# Patient Record
Sex: Male | Born: 1980 | Race: White | Hispanic: No | Marital: Single | Smoking: Current every day smoker
Health system: Southern US, Community
[De-identification: ages and names within clinical notes are randomized; demographics above are authoritative.]

## PROBLEM LIST (undated history)

## (undated) DIAGNOSIS — L0390 Cellulitis, unspecified: Secondary | ICD-10-CM

## (undated) DIAGNOSIS — S3210XA Unspecified fracture of sacrum, initial encounter for closed fracture: Secondary | ICD-10-CM

## (undated) DIAGNOSIS — G35D Multiple sclerosis, unspecified: Secondary | ICD-10-CM

## (undated) DIAGNOSIS — G35 Multiple sclerosis: Secondary | ICD-10-CM

---

## 2014-07-03 ENCOUNTER — Emergency Department (HOSPITAL_COMMUNITY): Payer: Self-pay

## 2014-07-03 ENCOUNTER — Emergency Department (HOSPITAL_COMMUNITY)
Admission: EM | Admit: 2014-07-03 | Discharge: 2014-07-03 | Disposition: A | Discharge status: Home / Self Care | Payer: Self-pay | Attending: Emergency Medicine | Admitting: Emergency Medicine

## 2014-07-03 ENCOUNTER — Encounter (HOSPITAL_COMMUNITY): Payer: Self-pay | Admitting: Emergency Medicine

## 2014-07-03 DIAGNOSIS — R930 Abnormal findings on diagnostic imaging of skull and head, not elsewhere classified: Secondary | ICD-10-CM | POA: Insufficient documentation

## 2014-07-03 DIAGNOSIS — F10121 Alcohol abuse with intoxication delirium: Secondary | ICD-10-CM | POA: Insufficient documentation

## 2014-07-03 DIAGNOSIS — Z720 Tobacco use: Secondary | ICD-10-CM | POA: Insufficient documentation

## 2014-07-03 DIAGNOSIS — F10920 Alcohol use, unspecified with intoxication, uncomplicated: Secondary | ICD-10-CM

## 2014-07-03 LAB — I-STAT CHEM 8, ED
BUN: 12 mg/dL (ref 6–23)
CHLORIDE: 99 meq/L (ref 96–112)
CREATININE: 1.2 mg/dL (ref 0.50–1.35)
Calcium, Ion: 1.09 mmol/L — ABNORMAL LOW (ref 1.12–1.23)
GLUCOSE: 117 mg/dL — AB (ref 70–99)
HCT: 50 % (ref 39.0–52.0)
Hemoglobin: 17 g/dL (ref 13.0–17.0)
POTASSIUM: 3.6 meq/L — AB (ref 3.7–5.3)
Sodium: 139 mEq/L (ref 137–147)
TCO2: 27 mmol/L (ref 0–100)

## 2014-07-03 LAB — DIFFERENTIAL
Basophils Absolute: 0 10*3/uL (ref 0.0–0.1)
Basophils Relative: 0 % (ref 0–1)
Eosinophils Absolute: 0.1 10*3/uL (ref 0.0–0.7)
Eosinophils Relative: 1 % (ref 0–5)
LYMPHS ABS: 1.2 10*3/uL (ref 0.7–4.0)
LYMPHS PCT: 10 % — AB (ref 12–46)
MONO ABS: 0.8 10*3/uL (ref 0.1–1.0)
MONOS PCT: 7 % (ref 3–12)
Neutro Abs: 10.5 10*3/uL — ABNORMAL HIGH (ref 1.7–7.7)
Neutrophils Relative %: 82 % — ABNORMAL HIGH (ref 43–77)

## 2014-07-03 LAB — CBC
HEMATOCRIT: 45.1 % (ref 39.0–52.0)
HEMOGLOBIN: 15.7 g/dL (ref 13.0–17.0)
MCH: 34.4 pg — ABNORMAL HIGH (ref 26.0–34.0)
MCHC: 34.8 g/dL (ref 30.0–36.0)
MCV: 98.7 fL (ref 78.0–100.0)
Platelets: 169 10*3/uL (ref 150–400)
RBC: 4.57 MIL/uL (ref 4.22–5.81)
RDW: 13.3 % (ref 11.5–15.5)
WBC: 12.6 10*3/uL — AB (ref 4.0–10.5)

## 2014-07-03 LAB — URINALYSIS, ROUTINE W REFLEX MICROSCOPIC
Bilirubin Urine: NEGATIVE
Glucose, UA: NEGATIVE mg/dL
Hgb urine dipstick: NEGATIVE
Leukocytes, UA: NEGATIVE
NITRITE: NEGATIVE
Specific Gravity, Urine: >1.03 — ABNORMAL HIGH (ref 1.005–1.030)
UROBILINOGEN UA: 1 mg/dL (ref 0.0–1.0)
pH: 5.5 (ref 5.0–8.0)

## 2014-07-03 LAB — RAPID URINE DRUG SCREEN, HOSP PERFORMED
Amphetamines: NOT DETECTED
BARBITURATES: NOT DETECTED
BENZODIAZEPINES: NOT DETECTED
Cocaine: NOT DETECTED
Opiates: POSITIVE — AB
Tetrahydrocannabinol: NOT DETECTED

## 2014-07-03 LAB — PROTIME-INR
INR: 1 (ref 0.00–1.49)
Prothrombin Time: 13.3 seconds (ref 11.6–15.2)

## 2014-07-03 LAB — ETHANOL: Alcohol, Ethyl (B): 42 mg/dL — ABNORMAL HIGH (ref 0–11)

## 2014-07-03 LAB — COMPREHENSIVE METABOLIC PANEL
ALT: 17 U/L (ref 0–53)
AST: 24 U/L (ref 0–37)
Albumin: 4.5 g/dL (ref 3.5–5.2)
Alkaline Phosphatase: 47 U/L (ref 39–117)
Anion gap: 13 (ref 5–15)
BILIRUBIN TOTAL: 0.2 mg/dL — AB (ref 0.3–1.2)
BUN: 13 mg/dL (ref 6–23)
CHLORIDE: 99 meq/L (ref 96–112)
CO2: 29 meq/L (ref 19–32)
CREATININE: 1.14 mg/dL (ref 0.50–1.35)
Calcium: 8.9 mg/dL (ref 8.4–10.5)
GFR calc Af Amer: >90 mL/min (ref >90)
GFR, EST NON AFRICAN AMERICAN: 84 mL/min — AB (ref >90)
Glucose, Bld: 114 mg/dL — ABNORMAL HIGH (ref 70–99)
Potassium: 3.8 mEq/L (ref 3.7–5.3)
Sodium: 141 mEq/L (ref 137–147)
Total Protein: 7.7 g/dL (ref 6.0–8.3)

## 2014-07-03 LAB — URINE MICROSCOPIC-ADD ON

## 2014-07-03 LAB — I-STAT TROPONIN, ED: Troponin i, poc: 0 ng/mL (ref 0.00–0.08)

## 2014-07-03 LAB — APTT: aPTT: 28 seconds (ref 24–37)

## 2014-07-03 MED ORDER — SODIUM CHLORIDE 0.9 % IV BOLUS (SEPSIS)
1000.0000 mL | Freq: Once | INTRAVENOUS | Status: AC
  Administered 2014-07-03: 1000 mL via INTRAVENOUS

## 2014-07-03 NOTE — ED Provider Notes (Signed)
CSN: [id number]     Arrival date & time 07/03/14  [id number] History   First MD Initiated Contact with Patient 07/03/14 [phone]     Chief Complaint  Patient presents with  . Weakness  . Dizziness      HPI Pt was seen at 0910.  Per pt, c/o unknown onset and persistence of constant generalized weakness/fatigue, lightheadedness and slurred speech that was noticed when he woke up this morning at 0400. Pt states he went to bed last night approximately 2300 "after having some drinks celebrating my birthday." Has been associated with one episode of N/V PTA. Pt states all his symptoms have been constant since onset and have not improved.  Denies LOC, no AMS, no CP/palpitations, no cough/SOB, no abd pain, no diarrhea, no fevers, no black or blood in stools or emesis, no focal motor weakness, no tingling/numbness in extremities.       History reviewed. No pertinent past medical history.  History reviewed. No pertinent past surgical history.  History  Substance Use Topics  . Smoking status: Current Every Day Smoker  . Smokeless tobacco: Not on file  . Alcohol Use: Yes     Comment: occ    Review of Systems ROS: Statement: All systems negative except as marked or noted in the HPI; Constitutional: Negative for fever and chills. +generalized weakness/fatigue.; ; Eyes: Negative for eye pain, redness and discharge. ; ; ENMT: Negative for ear pain, hoarseness, nasal congestion, sinus pressure and sore throat. ; ; Cardiovascular: Negative for chest pain, palpitations, diaphoresis, dyspnea and peripheral edema. ; ; Respiratory: Negative for cough, wheezing and stridor. ; ; Gastrointestinal: +N/V. Negative for diarrhea, abdominal pain, blood in stool, hematemesis, jaundice and rectal bleeding. . ; ; Genitourinary: Negative for dysuria, flank pain and hematuria. ; ; Musculoskeletal: Negative for back pain and neck pain. Negative for swelling and trauma.; ; Skin: Negative for pruritus, rash, abrasions, blisters,  bruising and skin lesion.; ; Neuro: +lightheadedness, slurred speech. Negative for headache and neck stiffness. Negative for altered level of consciousness , altered mental status, extremity weakness, paresthesias, involuntary movement, seizure and syncope.      Allergies  Review of patient's allergies indicates no known allergies.  Home Medications   Prior to Admission medications   Not on File   BP 115/66  Pulse 89  Temp(Src) 97.5 F (36.4 C) (Oral)  Resp 20  SpO2 99% Physical Exam 0915: Physical examination:  Nursing notes reviewed; Vital signs and O2 SAT reviewed;  Constitutional: Well developed, Well nourished, Well hydrated, In no acute distress; Head:  Normocephalic, atraumatic; Eyes: EOMI, PERRL, No scleral icterus; ENMT: Mouth and pharynx normal, Mucous membranes moist; Neck: Supple, Full range of motion, No lymphadenopathy; Cardiovascular: Regular rate and rhythm, No murmur, rub, or gallop; Respiratory: Breath sounds clear & equal bilaterally, No rales, rhonchi, wheezes.  Speaking full sentences with ease, Normal respiratory effort/excursion; Chest: Nontender, Movement normal; Abdomen: Soft, Nontender, Nondistended, Normal bowel sounds; Genitourinary: No CVA tenderness; Extremities: Pulses normal, No tenderness, No edema, No calf edema or asymmetry.; Neuro: AA&Ox3, Major CN grossly intact.Speech clear.  No facial droop.  No nystagmus. Grips equal. Strength 5/5 equal bilat UE's and LE's.  DTR 2/4 equal bilat UE's and LE's.  No gross sensory deficits.  Normal cerebellar testing bilat UE's (finger-nose) and LE's (heel-shin)..; Skin: Color normal, Warm, Dry.   ED Course  Procedures     EKG Interpretation   Date/Time:  Wednesday July 03 2014 09:11:41 EDT Ventricular Rate:  97 PR Interval:  128 QRS Duration: 109 QT Interval:  383 QTC Calculation: 486 R Axis:   88 Text Interpretation:  Sinus rhythm Consider right atrial enlargement  Benign early repolarization Borderline  prolonged QT interval Artifact No  old tracing to compare Confirmed by [facility]  MD, Nicholos Johns [phone]) on  07/03/2014 9:29:19 AM      MDM  MDM Reviewed: nursing note and vitals Interpretation: labs, ECG, x-ray and CT scan     Results for orders placed during the hospital encounter of 07/03/14  ETHANOL      Result Value Ref Range   Alcohol, Ethyl (B) 42 (*) 0 - 11 mg/dL  PROTIME-INR      Result Value Ref Range   Prothrombin Time 13.3  11.6 - 15.2 seconds   INR 1.00  0.00 - 1.49  APTT      Result Value Ref Range   aPTT 28  24 - 37 seconds  CBC      Result Value Ref Range   WBC 12.6 (*) 4.0 - 10.5 K/uL   RBC 4.57  4.22 - 5.81 MIL/uL   Hemoglobin 15.7  13.0 - 17.0 g/dL   HCT 00.3  49.1 - 79.1 %   MCV 98.7  78.0 - 100.0 fL   MCH 34.4 (*) 26.0 - 34.0 pg   MCHC 34.8  30.0 - 36.0 g/dL   RDW 50.5  69.7 - 94.8 %   Platelets 169  150 - 400 K/uL  DIFFERENTIAL      Result Value Ref Range   Neutrophils Relative % 82 (*) 43 - 77 %   Neutro Abs 10.5 (*) 1.7 - 7.7 K/uL   Lymphocytes Relative 10 (*) 12 - 46 %   Lymphs Abs 1.2  0.7 - 4.0 K/uL   Monocytes Relative 7  3 - 12 %   Monocytes Absolute 0.8  0.1 - 1.0 K/uL   Eosinophils Relative 1  0 - 5 %   Eosinophils Absolute 0.1  0.0 - 0.7 K/uL   Basophils Relative 0  0 - 1 %   Basophils Absolute 0.0  0.0 - 0.1 K/uL  COMPREHENSIVE METABOLIC PANEL      Result Value Ref Range   Sodium 141  137 - 147 mEq/L   Potassium 3.8  3.7 - 5.3 mEq/L   Chloride 99  96 - 112 mEq/L   CO2 29  19 - 32 mEq/L   Glucose, Bld 114 (*) 70 - 99 mg/dL   BUN 13  6 - 23 mg/dL   Creatinine, Ser 0.16  0.50 - 1.35 mg/dL   Calcium 8.9  8.4 - 55.3 mg/dL   Total Protein 7.7  6.0 - 8.3 g/dL   Albumin 4.5  3.5 - 5.2 g/dL   AST 24  0 - 37 U/L   ALT 17  0 - 53 U/L   Alkaline Phosphatase 47  39 - 117 U/L   Total Bilirubin 0.2 (*) 0.3 - 1.2 mg/dL   GFR calc non Af Amer 84 (*) >90 mL/min   GFR calc Af Amer >90  >90 mL/min   Anion gap 13  5 - 15  URINALYSIS, ROUTINE  W REFLEX MICROSCOPIC      Result Value Ref Range   Color, Urine YELLOW  YELLOW   APPearance CLEAR  CLEAR   Specific Gravity, Urine >1.030 (*) 1.005 - 1.030   pH 5.5  5.0 - 8.0   Glucose, UA NEGATIVE  NEGATIVE mg/dL   Hgb urine dipstick NEGATIVE  NEGATIVE  Bilirubin Urine NEGATIVE  NEGATIVE   Ketones, ur TRACE (*) NEGATIVE mg/dL   Protein, ur TRACE (*) NEGATIVE mg/dL   Urobilinogen, UA 1.0  0.0 - 1.0 mg/dL   Nitrite NEGATIVE  NEGATIVE   Leukocytes, UA NEGATIVE  NEGATIVE  URINE MICROSCOPIC-ADD ON      Result Value Ref Range   RBC / HPF 0-2  <3 RBC/hpf   Casts HYALINE CASTS (*) NEGATIVE  I-STAT CHEM 8, ED      Result Value Ref Range   Sodium 139  137 - 147 mEq/L   Potassium 3.6 (*) 3.7 - 5.3 mEq/L   Chloride 99  96 - 112 mEq/L   BUN 12  6 - 23 mg/dL   Creatinine, Ser 1.61  0.50 - 1.35 mg/dL   Glucose, Bld 096 (*) 70 - 99 mg/dL   Calcium, Ion 0.45 (*) 1.12 - 1.23 mmol/L   TCO2 27  0 - 100 mmol/L   Hemoglobin 17.0  13.0 - 17.0 g/dL   HCT 40.9  81.1 - 91.4 %  I-STAT TROPOININ, ED      Result Value Ref Range   Troponin i, poc 0.00  0.00 - 0.08 ng/mL   Comment 3            Dg Chest 2 View 07/03/2014   CLINICAL DATA:  Shortness of breath, nausea and weakness. Right arm and hand numbness. Symptoms began at 4 a.m. Smoker.  EXAM: CHEST  2 VIEW  COMPARISON:  None.  FINDINGS: The lungs are clear. Heart size is normal. There is no pneumothorax or pleural effusion. No focal bony abnormality is identified.  IMPRESSION: Negative chest.   Electronically Signed   By: Drusilla Kanner M.D.   On: 07/03/2014 10:32   Ct Head Wo Contrast 07/03/2014   CLINICAL DATA:  Weakness and dizziness.  Slurred speech  EXAM: CT HEAD WITHOUT CONTRAST  TECHNIQUE: Contiguous axial images were obtained from the base of the skull through the vertex without intravenous contrast.  COMPARISON:  None.  FINDINGS: Ventricle size is normal.  Cerebral volume is normal.  Hypodensity in the right centrum semiovale has the  appearance of chronic infarction. Negative for acute infarct. Negative for hemorrhage or mass lesion.  Calvarium is intact.  Mucosal edema in the right maxillary sinus.  IMPRESSION: Hypodensity in the centrum semiovale on the right most likely an area of chronic infarction. Correlate with symptoms. MRI may be helpful given the patient's age and lack of prior neuro imaging studies.   Electronically Signed   By: Marlan Palau M.D.   On: 07/03/2014 10:18   Mr Brain Wo Contrast 07/03/2014   CLINICAL DATA:  Right arm weakness. Dizziness. Symptoms began today.  EXAM: MRI HEAD WITHOUT CONTRAST  TECHNIQUE: Multiplanar, multiecho pulse sequences of the brain and surrounding structures were obtained without intravenous contrast.  COMPARISON:  CT 07/03/2014  FINDINGS: Diffusion imaging does not show any acute or subacute infarction.  The brainstem and cerebellum are normal. The cerebral hemispheres show scattered areas of abnormal T2 and FLAIR signal within the hemispheric deep white matter, pattern and distribution suggestive of chronic multiple sclerosis. No mass lesion, hemorrhage, hydrocephalus or extra-axial collection. No pituitary mass. Right maxillary sinus is full of fluid with and slightly hypoplastic.  IMPRESSION: No acute infarction.  Chronic appearing deep white matter lesions within the cerebral hemispheres suggestive of the diagnosis of chronic multiple sclerosis.   Electronically Signed   By: Paulina Fusi M.D.   On: 07/03/2014 11:35  1220:  Pt states he feels better now and wants to go home. Pt's NIH scale continues 0. Alcohol elevated 5 hours after pt states he woke up with symptoms (level at that time likely 100+). Pt endorses now that he drank "several 5th's" yesterday for his birthday celebration. Today's symptoms most consistent with etoh intoxication. MRI negative for acute CVA, but with suggestion of chronic MS. Pt endorses he had a CT-H approximately 3 years ago after hitting his head and was  told "they saw some spots on it and wanted me to follow up." Pt states he "never did follow up because I just didn't want to know." T/C to Neuro Dr. Gerilyn Pilgrim, case discussed, including:  HPI, pertinent PM/SHx, VS/PE, dx testing, ED course and treatment:  Requests to have pt f/u in office regarding MRI findings. Dx and testing d/w pt and family.  Questions answered.  Verb understanding, agreeable to d/c home with outpt f/u.    Samuel Jester, DO 07/05/14 [phone]

## 2014-07-03 NOTE — ED Notes (Signed)
Pt denies any questions about D/C. Pt states understanding. Pt instructed to increase fluids and follow up with neurologist.

## 2014-07-03 NOTE — Discharge Instructions (Signed)
°Emergency Department Resource Guide °1) Find a Doctor and Pay Out of Pocket °Although you won't have to find out who is covered by your insurance plan, it is a good idea to ask around and get recommendations. You will then need to call the office and see if the doctor you have chosen will accept you as a new patient and what types of options they offer for patients who are self-pay. Some doctors offer discounts or will set up payment plans for their patients who do not have insurance, but you will need to ask so you aren't surprised when you get to your appointment. ° °2) Contact Your Local Health Department °Not all health departments have doctors that can see patients for sick visits, but many do, so it is worth a call to see if yours does. If you don't know where your local health department is, you can check in your phone book. The CDC also has a tool to help you locate your state's health department, and many state websites also have listings of all of their local health departments. ° °3) Find a Walk-in Clinic °If your illness is not likely to be very severe or complicated, you may want to try a walk in clinic. These are popping up all over the country in pharmacies, drugstores, and shopping centers. They're usually staffed by nurse practitioners or physician assistants that have been trained to treat common illnesses and complaints. They're usually fairly quick and inexpensive. However, if you have serious medical issues or chronic medical problems, these are probably not your best option. ° °No Primary Care Doctor: °- Call [facility] Connect at  832-8000 - they can help you locate a primary care doctor that  accepts your insurance, provides certain services, etc. °- Physician Referral Service- 1-800-533-3463 ° °Chronic Pain Problems: °Organization         Address  Phone   Notes  °[facility]  ([phone]) [phone] Patients need to be referred by their primary care doctor.  ° °Medication  Assistance: °Organization         Address  Phone   Notes  °[place] [facility] [address] °[place], [place] [address] (336) 641-8030 --Must be a resident of [place] °-- Must have NO insurance coverage whatsoever (no Medicaid/ Medicare, etc.) °-- The pt. MUST have a primary care doctor that directs their care regularly and follows them in the community °  °[facility]  (866) 331-1348   °[employer]  (888) 892-1162   ° °Agencies that provide inexpensive medical care: °Organization         Address  Phone   Notes  °[facility]  (336) 832-8035   °[facility]    (336) 832-7272   °[facility] [address] °[place], [place] [address] (336) 832-4777   °[facility] [address], °[place] (336) 271-4999   °[facility]    (336) 373-0678   °[facility]    (336) 272-1050   °[facility] ° [address], [place] Phone:  (336) 832-4444, Fax:  (336) 832-4440 Hours of Operation:  9 am - 6 pm, M-F.  Also accepts Medicaid/Medicare and self-pay.  °[facility] ° [address], [place] Phone: (336) [phone], Fax: (336) [phone]. Hours of Operation:  8:30 am - 5:30 pm, M-F.  Also accepts Medicaid and self-pay.  °[facility] [place] [address], [place] Phone: (336) 878-6027   °[facility] [address], [place], [place] (336)723-1848, Ext. 123 Mondays & Thursdays: 7-9 AM.  First 15 patients are seen on a first come, first serve basis. °  ° °Medicaid-accepting [place] Providers: ° °Organization         Address  Phone   Notes  °[facility] [address], [place] (336) 641-2100 Also accepts self-pay patients.  °[facility] [address], [place] ° (336) 856-9996   °[facility] [address], [place]  (336) 288-8857   °[facility] [address] [place] [address], [place] (336) 299-7000   °Veita Bland [address], [place]  ° (336) 373-1557 Only accepts [place] Access Medicaid patients after they have their name applied to their card.  ° °Self-Pay (no insurance) in [place]: ° °Organization         Address  Phone   Notes  °Sickle Cell Patients, [facility] [address], [place] (336) 832-1970   °[facility] [address], [place] (336) 832-4400   °[facility] ° [address], [place] (336) 992-4800   °[facility]/Dr. Osei-Bonsu ° [address] [place] [address], [place] or [address], [place] (336) [phone] Phone number for both [place] and [place] locations is the same.  °[facility] [address], [place] (336) 299-0000   °[facility] [address] [place] [address], [place] or [address] (336) 852-7530 °(336) 878-2260   °[facility] [address], [place] (336) 350-1642, phone; (336) 294-5005, fax Sees patients 1st and 3rd Saturday of every month.  Must not qualify for public or private insurance (i.e. Medicaid, Medicare, [place] Health Choice, Veterans' Benefits) • Household income should be no more than 200% of the poverty level •The clinic cannot treat you if you are pregnant or think you are pregnant • Sexually transmitted diseases are not treated at the clinic.  ° ° °Dental Care: °Organization         Address  Phone  Notes  °[place] [facility] [address], [place] (336) 641-6152 Accepts children up to age 21 who are enrolled in Medicaid or [place] Health Choice; pregnant women with a Medicaid card; and children who have applied for Medicaid or [place] Health Choice, but were declined, whose parents can pay a reduced fee at time of service.  °[place]  [facility] [place]  [address], [place] (336) 641-7733 Accepts children up to age 21 who are enrolled in Medicaid or [place] Health Choice; pregnant women with a Medicaid card; and children who have applied for Medicaid or [place] Health Choice, but were declined, whose parents can pay a reduced fee at time of service.  °[facility] ° [address], [place] (336) 641-4533 Patients are seen by appointment only. Walk-ins are not accepted. [facility] will see patients 18 years of age and older. °Monday - Tuesday (8am-5pm) °Most Wednesdays (8:30-5pm) °$30 per visit, cash only  °[facility] ° [address], [place] (336) 641-4533 Patients are seen by appointment only. Walk-ins are not accepted. [facility] will see patients 18 years of age and older. °One   Wednesday Evening (Monthly: Volunteer Based).  $30 per visit, cash only  °[facility]  (919) 537-3737 for adults; Children under age 4, call Graduate Pediatric Dentistry at ([phone]) [phone]. Children aged 4-14, please call (919) [phone] to request a pediatric application. ° Dental services are provided in all areas of dental care including fillings, crowns and bridges, complete and partial dentures, implants, gum treatment, root canals, and extractions. Preventive care is also provided. Treatment is provided to both adults and children. °Patients are selected via a lottery and there is often a waiting list. °  °[facility] [address], °[place] ° (336) 763-8833 [email or web address] °  °[facility] [address], [place], [place] (336)723-1848, Ext. 123 Second and Fourth Thursday of each month, opens at 6:30 AM; Clinic ends at 9 AM.  Patients are seen on a first-come first-served basis, and a limited number are seen during each clinic.  ° °[facility] ° [address], [place], [place] (336) 723-7904    Eligibility Requirements °You must have lived in [place], [place], or [place] counties for at least the last three months. °  You cannot be eligible for state or federal sponsored healthcare insurance, including Veterans Administration, Medicaid, or Medicare. °  You generally cannot be eligible for healthcare insurance through your employer.  °  How to apply: °Eligibility screenings are held every Tuesday and Wednesday afternoon from 1:00 pm until 4:00 pm. You do not need an appointment for the interview!  °[facility] [address], [place], [place] 336-631-2330   °[facility]  336-342-8273   °[facility]  336-703-3100   °[facility]  336-570-6415   ° °Behavioral Health Resources in the Community: °Intensive Outpatient Programs °Organization         Address  Phone  Notes  °[facility] [address], [place], [place] 336-878-6098   °[facility] [address], [place], [place] 336-832-9800   °[facility] [address], [place], [place] ° 336-882-2125   °[facility] [address],  °[place], [place] 1-800-853-5163 or 336-641-4981   °Substance Abuse Resources °Organization         Address  Phone  Notes  °[facility]  336-882-2125   °[facility]  336-784-9470   °[facility]  336-285-9073   °[facility]  336-845-3988   °Residential & Outpatient Substance Abuse Program  1-800-659-3381   °Psychological Services °Organization         Address  Phone  Notes  °[facility]  336- 832-9600   °[facility]  336- 378-7881   °[facility] [address], [place] 1-800-853-5163 or 336-641-4981   ° °Mobile Crisis Teams °Organization         Address  Phone  Notes  °[facility], Mobile Crisis Care Unit  1-877-626-1772   °Assertive °[facility] ° [address]  [place], [place] 336-834-9664   °Sharon DeEsch [address] °[place] 336-554-5454   ° °Self-Help/Support Groups °Organization         Address  Phone             Notes  °[facility]  - variety of support groups  336- 373-1402 Call for more information  °Narcotics Anonymous (NA), [facility] [address], °[place]  2 meetings at this location  ° °  Residential Treatment Programs Organization         Address  Phone  Notes  [facility] [address],    [place]  9-024-097-3532   [facility]  [address], [place] [address], [place], [place] 834-196-2229   [facility] [address] [place], [place] [phone] Admissions: 8am-3pm M-F  [facility] [address]    [place], [place] [phone]   [facility] [address] [place], [place], [place] [phone]   [facility] [address],  [place], [place] [phone]   [facility] Intensive Outpatient [address], [place], [place] 741-287-8676   [facility] ([facility] [address] [place]  [place], [place] 7-209-470-9628 or (502) 057-5613   [facility] ([facility] [address] [place], [place] 650-354-6568 Accepts Medicaid  [facility] [address],  [place] 1-275-170-0174 Substance Abuse/Addiction Treatment   [facility] Organization         Address  Phone  Notes  [facility]  (949) 388-0177   Angie Fava, PhD [address] [place], [place]   (973)082-3218 or (930) 356-2335   [facility]   [address] [place], [place] [phone]   [facility] [address], [place], [place] 810-771-3367 Insurance/Medicaid/sponsorship through [facility] [address]                                    [place], [place] (519)[phone] Therapy/tele-psych/case    [facility] [address], [place] [phone]    Dr. Lolly Mustache  [phone]   [facility]  [employer] [facility] Dept. 1) [address], [place] 2) [address], [place] 3)  [address], [place] ([phone]  [phone]   [facility] Child Abuse Hotline 3302126847 or 4434811345 (After Hours)       Increase your fluid intake (ie:  Gatoraide) for the next few days, as discussed. Call the Neurologist today to schedule a follow up appointment this week.  Return to the Emergency Department immediately if not improving (or even worsening), any black or bloody stool or vomit, or for any other concerns.

## 2014-07-03 NOTE — ED Notes (Signed)
Pt reports woke up around 4am to get ready for work.  Says felt very nauseated, weak, and had difficulty forming his sentences.  Says feels weak all over but r arm and hand feel numb.  Pt reports feeling a little better and speech is better but not back to baseline per friend.

## 2014-07-08 ENCOUNTER — Encounter (HOSPITAL_COMMUNITY): Payer: Self-pay | Admitting: Emergency Medicine

## 2014-07-08 ENCOUNTER — Emergency Department (HOSPITAL_COMMUNITY)
Admission: EM | Admit: 2014-07-08 | Discharge: 2014-07-08 | Disposition: A | Discharge status: Home / Self Care | Payer: Self-pay | Attending: Emergency Medicine | Admitting: Emergency Medicine

## 2014-07-08 DIAGNOSIS — K006 Disturbances in tooth eruption: Secondary | ICD-10-CM | POA: Insufficient documentation

## 2014-07-08 DIAGNOSIS — K047 Periapical abscess without sinus: Secondary | ICD-10-CM | POA: Insufficient documentation

## 2014-07-08 DIAGNOSIS — Z720 Tobacco use: Secondary | ICD-10-CM | POA: Insufficient documentation

## 2014-07-08 DIAGNOSIS — Z8669 Personal history of other diseases of the nervous system and sense organs: Secondary | ICD-10-CM | POA: Insufficient documentation

## 2014-07-08 HISTORY — DX: Multiple sclerosis, unspecified: G35.D

## 2014-07-08 HISTORY — DX: Multiple sclerosis: G35

## 2014-07-08 MED ORDER — NAPROXEN 500 MG PO TABS: 500.0000 mg | ORAL_TABLET | Freq: Two times a day (BID) | ORAL | Status: DC

## 2014-07-08 MED ORDER — AMOXICILLIN 500 MG PO CAPS: 500.0000 mg | ORAL_CAPSULE | Freq: Three times a day (TID) | ORAL | Status: DC

## 2014-07-08 MED ORDER — AMOXICILLIN 250 MG PO CAPS
500.0000 mg | ORAL_CAPSULE | Freq: Once | ORAL | Status: AC
  Administered 2014-07-08: 500 mg via ORAL
  Filled 2014-07-08: qty 2

## 2014-07-08 MED ORDER — HYDROCODONE-ACETAMINOPHEN 5-325 MG PO TABS
2.0000 | ORAL_TABLET | Freq: Once | ORAL | Status: AC
  Administered 2014-07-08: 2 via ORAL
  Filled 2014-07-08: qty 2

## 2014-07-08 MED ORDER — HYDROCODONE-ACETAMINOPHEN 5-325 MG PO TABS: 2.0000 | ORAL_TABLET | ORAL | Status: DC | PRN

## 2014-07-08 NOTE — Discharge Instructions (Signed)
Please call your doctor for a followup appointment within 24-48 hours. When you talk to your doctor please let them know that you were seen in the emergency department and have them acquire all of your records so that they can discuss the findings with you and formulate a treatment plan to fully care for your new and ongoing problems.   Emergency Department Resource Guide 1) Find a Doctor and Pay Out of Pocket Although you won't have to find out who is covered by your insurance plan, it is a good idea to ask around and get recommendations. You will then need to call the office and see if the doctor you have chosen will accept you as a new patient and what types of options they offer for patients who are self-pay. Some doctors offer discounts or will set up payment plans for their patients who do not have insurance, but you will need to ask so you aren't surprised when you get to your appointment.  2) Contact Your Local Health Department Not all health departments have doctors that can see patients for sick visits, but many do, so it is worth a call to see if yours does. If you don't know where your local health department is, you can check in your phone book. The CDC also has a tool to help you locate your state's health department, and many state websites also have listings of all of their local health departments.  3) Find a Walk-in Clinic If your illness is not likely to be very severe or complicated, you may want to try a walk in clinic. These are popping up all over the country in pharmacies, drugstores, and shopping centers. They're usually staffed by nurse practitioners or physician assistants that have been trained to treat common illnesses and complaints. They're usually fairly quick and inexpensive. However, if you have serious medical issues or chronic medical problems, these are probably not your best option.  No Primary Care Doctor: - Call Health Connect at  [phone] - they can help you  locate a primary care doctor that  accepts your insurance, provides certain services, etc. - Physician Referral Service- [phone]  Chronic Pain Problems: Organization         Address  Phone   Notes  [facility]  [phone] Patients need to be referred by their primary care doctor.   Medication Assistance: Organization         Address  Phone   Notes  [facility] [address] [place], [place] [address] [phone] --Must be a resident of [place] -- Must have NO insurance coverage whatsoever (no Medicaid/ Medicare, etc.) -- The pt. MUST have a primary care doctor that directs their care regularly and follows them in the community   [facility]  [phone]   [employer]  [phone]    Agencies that provide inexpensive medical care: Organization         Address  Phone   Notes  [facility]  [phone]   [facility]    [phone]   [facility] [address] [place], [place] [address] [phone]   [facility] [address], [place] [phone]   [facility]    [phone]) [phone]   [facility]    [phone]   [facility]  [address], [place] Phone:  [phone], Fax:  [phone] Hours of Operation:  9 am - 6 pm, M-F.  Also accepts Medicaid/Medicare and self-pay.  °[facility] ° [address], [place] Phone: [phone], Fax: [phone]. Hours of Operation:  8:30 am - 5:30 pm, M-F.  Also accepts Medicaid and self-pay.  °[facility] [place] [address], [place] Phone: [phone]   °[facility] [address], [place], [place] [phone], Ext. [phone] Mondays & Thursdays: 7-9 AM.  First 15 patients are seen on a first come, first serve basis. °  ° °Medicaid-accepting [place]  Providers: ° °Organization         Address  Phone   Notes  °[facility] [address], [place] [phone] Also accepts self-pay patients.  °[facility] [address], [place] ° [phone]   °[facility] [address], [place] [phone]   °[facility] [address] [place] [address], [place] [phone]   °Veita Bland [address], [place]  ° [phone] Only accepts [place] Access Medicaid patients after they have their name applied to their card.  ° °Self-Pay (no insurance) in [place]: ° °Organization         Address  Phone   Notes  °Sickle Cell Patients, [facility] [address], [place] [phone]   °[facility] [address], [place] [phone]   °[facility] ° [address],  [phone]   °[facility]/Dr. Osei-Bonsu ° [address] [place] [address], [place] or [address], [place] [phone] Phone number for both [place] and [place] locations is the same.  °[facility] [address], [place] [phone]   °[facility] [address] [place] [address], [place] or [address] [phone] °([phone]   °[facility] [address], [place] [phone], phone; [phone], fax Sees patients 1st and 3rd Saturday of every month.  Must not qualify for public or private insurance (i.e. Medicaid, Medicare, [place] Health Choice, Veterans' Benefits) • Household income should be no more than 200% of the poverty level •The clinic cannot treat you if you are pregnant or think you are pregnant • Sexually transmitted diseases are not treated at the clinic.  ° ° °Dental Care: °Organization         Address  Phone  Notes  °[facility] [address], [place] [phone] Accepts children up to age 21 who are enrolled in Medicaid or [place] Health Choice; pregnant women with a Medicaid card; and children who have applied for Medicaid or [place] Health Choice, but were declined, whose parents can pay a reduced fee at time of service.  °[facility] [place]  [address], [place] [phone] Accepts children up to age 21 who are enrolled in Medicaid or  Health Choice; pregnant women with a Medicaid card; and children who have applied for Medicaid or  Health Choice, but were declined, whose parents can pay a reduced fee at time of service.  °[facility] ° [address], [place] [phone] Patients are seen by appointment only. Walk-ins are   not accepted. [facility] will see patients 41 years of age and older. Monday - Tuesday (8am-5pm) Most Wednesdays (8:30-5pm) $30 per visit, cash only  [facility]  [address], [facility] [phone] Patients are seen by appointment only. Walk-ins are not accepted. [facility] will see patients 48 years of age and older. One Wednesday Evening (Monthly: Volunteer Based).  $30 per visit, cash only  [employer]  [phone] for adults; Children under age 35, call Graduate Pediatric Dentistry at [phone]. Children aged 46-14, please call [phone] to request a pediatric application.  Dental services are provided in all areas of dental care including fillings, crowns and bridges, complete and partial dentures, implants, gum treatment, root canals, and extractions. Preventive care is also provided. Treatment is provided to both adults and children. Patients are selected via a lottery and there is often a waiting list.   [facility] [address] [place]  [phone] [email or web address]   [facility]  [address] [place], [place] [phone], Ext. [phone] Second and Fourth Thursday of each month, opens at 6:30 AM; Clinic ends at 9 AM.  Patients are seen on a first-come first-served basis, and a limited number are seen during each clinic.   [facility]  [address] [place], [place] [phone]   Eligibility Requirements You must have lived in [place], [place], or [place] counties for at least the last three months.   You cannot be eligible for state or federal sponsored [place], including [employer], [place], or [employer].   You generally cannot be eligible for healthcare insurance through your employer.    How to apply: Eligibility screenings are held every Tuesday and Wednesday afternoon from 1:00 pm until 4:00 pm. You do not need an appointment for the interview!  [facility] [address], [place], [place] [phone]   [facility] Health Department  [phone]   [facility] Department  [phone]   [facility]  [phone]

## 2014-07-08 NOTE — ED Provider Notes (Signed)
CSN: [id number]     Arrival date & time 07/08/14  2033 History   First MD Initiated Contact with Patient 07/08/14 2041     Chief Complaint  Patient presents with  . Dental Pain     (Consider location/radiation/quality/duration/timing/severity/associated sxs/prior Treatment) HPI Comments: The patient is a 33 year old male who presents with a complaint of a toothache. The dental pain is in his lower right jaw near the back of his jaw. He has mild associated swelling of his right jaw. He has no difficulty opening his mouth and has not had any fevers chills nausea or vomiting. The symptoms are persistent, they started yesterday, they are gradually worsening, they are worsened with chewing or palpation of his teeth on the right lower jaw. He states that he has had multiple fillings in the past, they have started to come out and his teeth are chipping away, he has not seen a dentist.  Patient is a 33 y.o. male presenting with tooth pain. The history is provided by the patient.  Dental Pain Associated symptoms: no fever     Past Medical History  Diagnosis Date  . MS (multiple sclerosis)    History reviewed. No pertinent past surgical history. No family history on file. History  Substance Use Topics  . Smoking status: Current Every Day Smoker  . Smokeless tobacco: Not on file  . Alcohol Use: Yes     Comment: occ    Review of Systems  Constitutional: Negative for fever.  HENT: Positive for dental problem.       Allergies  Review of patient's allergies indicates no known allergies.  Home Medications   Prior to Admission medications   Medication Sig Start Date End Date Taking? Authorizing Provider  amoxicillin (AMOXIL) 500 MG capsule Take 1 capsule (500 mg total) by mouth 3 (three) times daily. 07/08/14   Vida Roller, MD  HYDROcodone-acetaminophen (NORCO/VICODIN) 5-325 MG per tablet Take 2 tablets by mouth every 4 (four) hours as needed. 07/08/14   Vida Roller, MD  naproxen  (NAPROSYN) 500 MG tablet Take 1 tablet (500 mg total) by mouth 2 (two) times daily with a meal. 07/08/14   Vida Roller, MD   BP 148/69  Pulse 80  Temp(Src) 98.2 F (36.8 C) (Oral)  Resp 20  Ht 5\' 8"  (1.727 m)  Wt 142 lb (64.411 kg)  BMI 21.60 kg/m2  SpO2 100% Physical Exam  Nursing note and vitals reviewed. Constitutional: He appears well-developed and well-nourished. No distress.  HENT:  Head: Normocephalic and atraumatic.  Very poor dentition, multiple teeth with severe erosion, right lower rear molars with severe erosion with tenderness over these areas, no surrounding swelling but there is some redness of the gingiva, no fluctuant abscess seen, no asymmetry of the jaw, no trismus or torticollis, no tenderness under the tongue, normal temperature, normal phonation and  Eyes: Conjunctivae and EOM are normal. Pupils are equal, round, and reactive to light. Right eye exhibits no discharge. Left eye exhibits no discharge. No scleral icterus.  Neck: Normal range of motion. Neck supple. No JVD present. No thyromegaly present.  Cardiovascular: Normal rate and intact distal pulses.   Pulmonary/Chest: Effort normal. No respiratory distress.  Abdominal: Bowel sounds are normal.  Musculoskeletal: Normal range of motion. He exhibits no edema and no tenderness.  Lymphadenopathy:    He has no cervical adenopathy.  Neurological: He is alert. Coordination normal.  Skin: Skin is warm and dry. No rash noted. No erythema.  Psychiatric: He has  a normal mood and affect. His behavior is normal.    ED Course  Procedures (including critical care time) Labs Review Labs Reviewed - No data to display  Imaging Review No results found.    MDM   Final diagnoses:  Dental abscess    The patient has signs and symptoms of an early dental abscess, we'll give antibiotics and pain medication, referral to local dental followup, patient states that he recently tried to followup for his abnormal MRI  showing possible multiple sclerosis, he cannot afford the co-pay, he will be referred to low-cost or free dental clinics.  Meds given in ED:  Medications  amoxicillin (AMOXIL) capsule 500 mg (not administered)  HYDROcodone-acetaminophen (NORCO/VICODIN) 5-325 MG per tablet 2 tablet (not administered)    New Prescriptions   AMOXICILLIN (AMOXIL) 500 MG CAPSULE    Take 1 capsule (500 mg total) by mouth 3 (three) times daily.   HYDROCODONE-ACETAMINOPHEN (NORCO/VICODIN) 5-325 MG PER TABLET    Take 2 tablets by mouth every 4 (four) hours as needed.   NAPROXEN (NAPROSYN) 500 MG TABLET    Take 1 tablet (500 mg total) by mouth 2 (two) times daily with a meal.        Vida RollerBrian D Denessa Cavan, MD 07/08/14 2103

## 2014-07-08 NOTE — ED Notes (Signed)
Pt c/o pain to the right back side of mouth. Pt states he believes he has an abscessed tooth.

## 2016-08-13 ENCOUNTER — Encounter (HOSPITAL_COMMUNITY): Payer: Self-pay

## 2016-08-13 ENCOUNTER — Emergency Department (HOSPITAL_COMMUNITY): Payer: Self-pay

## 2016-08-13 ENCOUNTER — Emergency Department (HOSPITAL_COMMUNITY)
Admission: EM | Admit: 2016-08-13 | Discharge: 2016-08-13 | Discharge status: Against Medical Advice | Payer: Self-pay | Attending: Emergency Medicine | Admitting: Emergency Medicine

## 2016-08-13 DIAGNOSIS — F17200 Nicotine dependence, unspecified, uncomplicated: Secondary | ICD-10-CM | POA: Insufficient documentation

## 2016-08-13 DIAGNOSIS — T50901A Poisoning by unspecified drugs, medicaments and biological substances, accidental (unintentional), initial encounter: Secondary | ICD-10-CM

## 2016-08-13 DIAGNOSIS — T5191XA Toxic effect of unspecified alcohol, accidental (unintentional), initial encounter: Secondary | ICD-10-CM | POA: Insufficient documentation

## 2016-08-13 LAB — CBC WITH DIFFERENTIAL/PLATELET
BASOS ABS: 0 10*3/uL (ref 0.0–0.1)
Basophils Relative: 0 %
EOS ABS: 0.1 10*3/uL (ref 0.0–0.7)
EOS PCT: 2 %
HEMATOCRIT: 49.6 % (ref 39.0–52.0)
Hemoglobin: 17.1 g/dL — ABNORMAL HIGH (ref 13.0–17.0)
Lymphocytes Relative: 46 %
Lymphs Abs: 3.2 10*3/uL (ref 0.7–4.0)
MCH: 33.8 pg (ref 26.0–34.0)
MCHC: 34.5 g/dL (ref 30.0–36.0)
MCV: 98 fL (ref 78.0–100.0)
MONOS PCT: 6 %
Monocytes Absolute: 0.4 10*3/uL (ref 0.1–1.0)
Neutro Abs: 3.1 10*3/uL (ref 1.7–7.7)
Neutrophils Relative %: 46 %
PLATELETS: 206 10*3/uL (ref 150–400)
RBC: 5.06 MIL/uL (ref 4.22–5.81)
RDW: 13.4 % (ref 11.5–15.5)
WBC: 6.9 10*3/uL (ref 4.0–10.5)

## 2016-08-13 LAB — ETHANOL: Alcohol, Ethyl (B): 327 mg/dL (ref <5)

## 2016-08-13 LAB — COMPREHENSIVE METABOLIC PANEL
ALBUMIN: 4.6 g/dL (ref 3.5–5.0)
ALT: 22 U/L (ref 17–63)
ANION GAP: 10 (ref 5–15)
AST: 25 U/L (ref 15–41)
Alkaline Phosphatase: 47 U/L (ref 38–126)
BILIRUBIN TOTAL: 0.5 mg/dL (ref 0.3–1.2)
BUN: 13 mg/dL (ref 6–20)
CHLORIDE: 103 mmol/L (ref 101–111)
CO2: 27 mmol/L (ref 22–32)
Calcium: 8.5 mg/dL — ABNORMAL LOW (ref 8.9–10.3)
Creatinine, Ser: 1.15 mg/dL (ref 0.61–1.24)
GFR calc Af Amer: >60 mL/min (ref >60)
GFR calc non Af Amer: >60 mL/min (ref >60)
GLUCOSE: 134 mg/dL — AB (ref 65–99)
POTASSIUM: 3.6 mmol/L (ref 3.5–5.1)
SODIUM: 140 mmol/L (ref 135–145)
Total Protein: 7.8 g/dL (ref 6.5–8.1)

## 2016-08-13 MED ORDER — SODIUM CHLORIDE 0.9 % IV BOLUS (SEPSIS): 1000.0000 mL | Freq: Once | INTRAVENOUS | Status: DC

## 2016-08-13 NOTE — ED Notes (Signed)
Pt left AMA (signed out)    Pt's roommate agreed to drive patient home and states she will be watching him very closely tonight.

## 2016-08-13 NOTE — ED Triage Notes (Addendum)
The patient arrives via [facility] for posssible overdose.  Pt was found unresponsive by friends, ems was called, fire dept arrived first and administered 2 mg of narcan without response. Ems arrived and administered another 2 mg of narcan and the patient woke up.  Pt denies taking too much medication, and states "I've just been drinking too much"  Pt is awake, alert, oriented at this time and denies attempt at self-harm.

## 2016-08-13 NOTE — ED Notes (Addendum)
CRITICAL VALUE ALERT  Critical value received:ETOH- 327  Date of notification:  08/13/16  Time of notification:  Dr Estell Harpin  Critical value read back:Yes.    Nurse who received alert:  Meridee Score, RN  MD notified (1st page):  2035  Time of first page:  2035  MD notified (2nd page):  Time of second page:  Responding MD:  Dr Estell Harpin  Time MD responded:  2035

## 2016-08-13 NOTE — ED Notes (Signed)
Went to room to check on the patient and the patient was standing at the end of the bed after having pulled his monitor wires and leads off, and removed his IV from his left hand.  Pt's roommate is present and willing to take the patient home if needed.

## 2016-08-19 NOTE — ED Provider Notes (Signed)
[facility]-EMERGENCY DEPT Provider Note   CSN: [id number] Arrival date & time: 08/13/16  1921     History   Chief Complaint Chief Complaint  Patient presents with  . Drug Overdose    HPI Jesse Gibbs is a 35 y.o. male.  Patient was found unresponsive by friends. By department gave him Narcan without response EMS gave Narcan and he awoke.    Loss of Consciousness   This is a new problem. The current episode started less than 1 hour ago. The problem occurs rarely. The problem has been resolved. He lost consciousness for a period of greater than 5 minutes. Associated with: Drinking alcohol. Pertinent negatives include abdominal pain, back pain, chest pain, congestion, headaches and seizures. Treatments tried: Narcan. His past medical history does not include CVA.    Past Medical History:  Diagnosis Date  . MS (multiple sclerosis) (HCC)     There are no active problems to display for this patient.   No past surgical history on file.     Home Medications    Prior to Admission medications   Medication Sig Start Date End Date Taking? Authorizing Provider  amoxicillin (AMOXIL) 500 MG capsule Take 1 capsule (500 mg total) by mouth 3 (three) times daily. 07/08/14   Eber Hong, MD  HYDROcodone-acetaminophen (NORCO/VICODIN) 5-325 MG per tablet Take 2 tablets by mouth every 4 (four) hours as needed. 07/08/14   Eber Hong, MD  naproxen (NAPROSYN) 500 MG tablet Take 1 tablet (500 mg total) by mouth 2 (two) times daily with a meal. 07/08/14   Eber Hong, MD    Family History No family history on file.  Social History Social History  Substance Use Topics  . Smoking status: Current Every Day Smoker  . Smokeless tobacco: Not on file  . Alcohol use Yes     Comment: occ     Allergies   Patient has no known allergies.   Review of Systems Review of Systems  Constitutional: Negative for appetite change and fatigue.  HENT: Negative for congestion, ear discharge and sinus  pressure.   Eyes: Negative for discharge.  Respiratory: Negative for cough.   Cardiovascular: Positive for syncope. Negative for chest pain.  Gastrointestinal: Negative for abdominal pain and diarrhea.  Genitourinary: Negative for frequency and hematuria.  Musculoskeletal: Negative for back pain.  Skin: Negative for rash.  Neurological: Negative for seizures and headaches.  Psychiatric/Behavioral: Negative for hallucinations.     Physical Exam Updated Vital Signs BP 119/76 (BP Location: Left Arm)   Pulse 100   Temp 97.4 F (36.3 C) (Oral)   Resp 20   SpO2 96%   Physical Exam  Constitutional: He is oriented to person, place, and time. He appears well-developed.  HENT:  Head: Normocephalic.  Eyes: Conjunctivae and EOM are normal. No scleral icterus.  Neck: Neck supple. No thyromegaly present.  Cardiovascular: Normal rate and regular rhythm.  Exam reveals no gallop and no friction rub.   No murmur heard. Pulmonary/Chest: No stridor. He has no wheezes. He has no rales. He exhibits no tenderness.  Abdominal: He exhibits no distension. There is no tenderness. There is no rebound.  Musculoskeletal: Normal range of motion. He exhibits no edema.  Lymphadenopathy:    He has no cervical adenopathy.  Neurological: He is oriented to person, place, and time. He exhibits normal muscle tone. Coordination normal.  Patient minimally lethargic.  Skin: No rash noted. No erythema.  Psychiatric: He has a normal mood and affect. His behavior is normal.  ED Treatments / Results  Labs (all labs ordered are listed, but only abnormal results are displayed) Labs Reviewed  CBC WITH DIFFERENTIAL/PLATELET - Abnormal; Notable for the following:       Result Value   Hemoglobin 17.1 (*)    All other components within normal limits  COMPREHENSIVE METABOLIC PANEL - Abnormal; Notable for the following:    Glucose, Bld 134 (*)    Calcium 8.5 (*)    All other components within normal limits    ETHANOL - Abnormal; Notable for the following:    Alcohol, Ethyl (B) 327 (*)    All other components within normal limits    EKG  EKG Interpretation None       Radiology No results found.  Procedures Procedures (including critical care time)  Medications Ordered in ED Medications - No data to display   Initial Impression / Assessment and Plan / ED Course  I have reviewed the triage vital signs and the nursing notes.  Pertinent labs & imaging results that were available during my care of the patient were reviewed by me and considered in my medical decision making (see chart for details).  Clinical Course     Patient with EtOH abuse and syncope. Patient refused treatment at this time patient was able to ambulate without problems. Patient left AMA  Final Clinical Impressions(s) / ED Diagnoses   Final diagnoses:  Accidental drug overdose, initial encounter    New Prescriptions Discharge Medication List as of 08/13/2016  8:42 PM       Bethann BerkshireJoseph Andelyn Spade, MD 08/19/16 1109

## 2017-03-30 ENCOUNTER — Other Ambulatory Visit: Payer: Self-pay | Admitting: Family Medicine

## 2017-03-30 DIAGNOSIS — M545 Low back pain: Secondary | ICD-10-CM

## 2017-04-05 ENCOUNTER — Ambulatory Visit (HOSPITAL_COMMUNITY): Payer: Self-pay

## 2017-04-08 ENCOUNTER — Ambulatory Visit (HOSPITAL_COMMUNITY)
Admission: RE | Admit: 2017-04-08 | Discharge: 2017-04-08 | Disposition: A | Discharge status: Home / Self Care | Payer: Self-pay | Source: Ambulatory Visit | Attending: Family Medicine | Admitting: Family Medicine

## 2017-04-08 DIAGNOSIS — M48061 Spinal stenosis, lumbar region without neurogenic claudication: Secondary | ICD-10-CM | POA: Insufficient documentation

## 2017-04-08 DIAGNOSIS — M5124 Other intervertebral disc displacement, thoracic region: Secondary | ICD-10-CM | POA: Insufficient documentation

## 2017-04-08 DIAGNOSIS — M545 Low back pain: Secondary | ICD-10-CM

## 2017-04-08 DIAGNOSIS — M5125 Other intervertebral disc displacement, thoracolumbar region: Secondary | ICD-10-CM | POA: Insufficient documentation

## 2017-04-08 DIAGNOSIS — M5127 Other intervertebral disc displacement, lumbosacral region: Secondary | ICD-10-CM | POA: Insufficient documentation

## 2019-03-02 ENCOUNTER — Other Ambulatory Visit: Payer: Self-pay

## 2019-03-02 ENCOUNTER — Encounter (HOSPITAL_COMMUNITY): Payer: Self-pay | Admitting: *Deleted

## 2019-03-02 ENCOUNTER — Emergency Department (HOSPITAL_COMMUNITY)
Admission: EM | Admit: 2019-03-02 | Discharge: 2019-03-02 | Disposition: A | Discharge status: Home / Self Care | Payer: Self-pay | Attending: Emergency Medicine | Admitting: Emergency Medicine

## 2019-03-02 DIAGNOSIS — M545 Low back pain, unspecified: Secondary | ICD-10-CM

## 2019-03-02 DIAGNOSIS — F17210 Nicotine dependence, cigarettes, uncomplicated: Secondary | ICD-10-CM | POA: Insufficient documentation

## 2019-03-02 DIAGNOSIS — Z79899 Other long term (current) drug therapy: Secondary | ICD-10-CM | POA: Insufficient documentation

## 2019-03-02 HISTORY — DX: Unspecified fracture of sacrum, initial encounter for closed fracture: S32.10XA

## 2019-03-02 MED ORDER — HYDROMORPHONE HCL 1 MG/ML IJ SOLN
1.0000 mg | Freq: Once | INTRAMUSCULAR | Status: AC
  Administered 2019-03-02: 12:00:00 1 mg via INTRAMUSCULAR
  Filled 2019-03-02: qty 1

## 2019-03-02 MED ORDER — KETOROLAC TROMETHAMINE 30 MG/ML IJ SOLN
30.0000 mg | Freq: Once | INTRAMUSCULAR | Status: AC
  Administered 2019-03-02: 12:00:00 30 mg via INTRAMUSCULAR
  Filled 2019-03-02: qty 1

## 2019-03-02 MED ORDER — NAPROXEN 500 MG PO TABS: 500.0000 mg | ORAL_TABLET | Freq: Two times a day (BID) | ORAL | 0 refills | Status: DC

## 2019-03-02 MED ORDER — METHOCARBAMOL 750 MG PO TABS: 750.0000 mg | ORAL_TABLET | Freq: Four times a day (QID) | ORAL | 0 refills | Status: DC

## 2019-03-02 NOTE — ED Notes (Signed)
ED Provider at bedside. 

## 2019-03-02 NOTE — ED Provider Notes (Signed)
[facility] EMERGENCY DEPARTMENT Provider Note   CSN: [id number] Arrival date & time: 03/02/19  5176    History   Chief Complaint Chief Complaint  Patient presents with  . Back Pain    HPI Jesse Gibbs is a 38 y.o. male with a distant history of left sacral fracture sustained in the [place] with residual chronic pain and intermittent low back pain presenting with worsened localized pain in his left lower back starting a few days ago after lifting furniture.  He describes a burning pain with certain movements, especially with attempts to stand from a sitting position.  He denies radiation of pain and has no urinary or fecal incontinence or retention.  He takes oxycodone tid to qid for chronic pain which is not improving his back symptoms.  No IVDU, no h/o cancer.     The history is provided by the patient.    Past Medical History:  Diagnosis Date  . MS (multiple sclerosis) ([place])   . Sacral fracture ([facility])     There are no active problems to display for this patient.   History reviewed. No pertinent surgical history.      Home Medications    Prior to Admission medications   Medication Sig Start Date End Date Taking? Authorizing Provider  amoxicillin (AMOXIL) 500 MG capsule Take 1 capsule (500 mg total) by mouth 3 (three) times daily. 07/08/14   Noemi Chapel, MD  HYDROcodone-acetaminophen (NORCO/VICODIN) 5-325 MG per tablet Take 2 tablets by mouth every 4 (four) hours as needed. 07/08/14   Noemi Chapel, MD  methocarbamol (ROBAXIN-750) 750 MG tablet Take 1 tablet (750 mg total) by mouth 4 (four) times daily. 03/02/19   Evalee Jefferson, PA-C  naproxen (NAPROSYN) 500 MG tablet Take 1 tablet (500 mg total) by mouth 2 (two) times daily. 03/02/19   Evalee Jefferson, PA-C    Family History No family history on file.  Social History Social History   Tobacco Use  . Smoking status: Current Every Day Smoker    Packs/day: 1.00    Types: Cigarettes  . Smokeless tobacco: Never Used   Substance Use Topics  . Alcohol use: Yes    Alcohol/week: 28.0 standard drinks    Types: 28 Cans of beer per week    Comment: 3-4 beers nightly   . Drug use: No     Allergies   Patient has no known allergies.   Review of Systems Review of Systems  Constitutional: Negative for fever.  Respiratory: Negative for shortness of breath.   Cardiovascular: Negative for chest pain and leg swelling.  Gastrointestinal: Negative for abdominal distention, abdominal pain and constipation.  Genitourinary: Negative for difficulty urinating, dysuria, flank pain, frequency and urgency.  Musculoskeletal: Positive for back pain. Negative for gait problem and joint swelling.  Skin: Negative for rash.  Neurological: Negative for weakness and numbness.     Physical Exam Updated Vital Signs BP 137/75 (BP Location: Right Arm)   Pulse 86   Temp 98.3 F (36.8 C) (Oral)   Resp 18   Ht 5\' 8"  (1.727 m)   Wt 72.6 kg   SpO2 98%   BMI 24.33 kg/m   Physical Exam Vitals signs and nursing note reviewed.  Constitutional:      Appearance: He is well-developed.  HENT:     Head: Normocephalic.  Eyes:     Conjunctiva/sclera: Conjunctivae normal.  Neck:     Musculoskeletal: Normal range of motion and neck supple.  Cardiovascular:     Rate and  Rhythm: Normal rate.     Comments: Pedal pulses normal. Pulmonary:     Effort: Pulmonary effort is normal.  Abdominal:     General: Bowel sounds are normal. There is no distension.     Palpations: Abdomen is soft. There is no mass.  Musculoskeletal: Normal range of motion.     Right shoulder: He exhibits tenderness. He exhibits no bony tenderness, no swelling and no spasm.     Lumbar back: He exhibits tenderness. He exhibits no swelling, no edema and no spasm.     Comments: Localized ttp left paralumbar region. No midline pain or deformity.  No erythema or edema.  Skin:    General: Skin is warm and dry.  Neurological:     Mental Status: He is alert.      Sensory: No sensory deficit.     Motor: No tremor or atrophy.     Gait: Gait normal.     Deep Tendon Reflexes:     Reflex Scores:      Patellar reflexes are 2+ on the right side and 2+ on the left side.    Comments: No strength deficit noted in hip and knee flexor and extensor muscle groups.  Ankle flexion and extension intact.      ED Treatments / Results  Labs (all labs ordered are listed, but only abnormal results are displayed) Labs Reviewed - No data to display  EKG None  Radiology No results found.  Procedures Procedures (including critical care time)  Medications Ordered in ED Medications  HYDROmorphone (DILAUDID) injection 1 mg (has no administration in time range)  ketorolac (TORADOL) 30 MG/ML injection 30 mg (has no administration in time range)     Initial Impression / Assessment and Plan / ED Course  I have reviewed the triage vital signs and the nursing notes.  Pertinent labs & imaging results that were available during my care of the patient were reviewed by me and considered in my medical decision making (see chart for details).        No neuro deficit on exam or by history to suggest emergent or surgical presentation.  Also discussed worsened sx that should prompt immediate re-evaluation including distal weakness, bowel/bladder retention/incontinence.    Final Clinical Impressions(s) / ED Diagnoses   Final diagnoses:  Acute left-sided low back pain without sciatica    ED Discharge Orders         Ordered    naproxen (NAPROSYN) 500 MG tablet  2 times daily     03/02/19 1124    methocarbamol (ROBAXIN-750) 750 MG tablet  4 times daily     03/02/19 1124           Burgess Amor, Cordelia Poche 03/02/19 1127    Donnetta Hutching, MD 03/12/19 1550

## 2019-03-02 NOTE — ED Triage Notes (Signed)
Pt c/o lower back that started a few days ago after lifting furniture. Pt reports the pain has been getting progressively worse. Pt reports the pain only hurts when he is standing, not sitting. Denies numbness or tingling in lower extremities.

## 2019-03-02 NOTE — Discharge Instructions (Signed)
Avoid lifting,  Bending,  Twisting or any other activity that worsens your pain over the next week.  Apply a heating pad to your lower back 20 minutes 3 times daily (or use your thermal patch).  You should get rechecked if your symptoms are not better over the next 5 days,  Or you develop increased pain,  Weakness in your leg(s) or loss of bladder or bowel function - these are symptoms of a worse injury. Take the medicines prescribed.

## 2019-11-19 ENCOUNTER — Emergency Department (HOSPITAL_COMMUNITY): Payer: Self-pay

## 2019-11-19 ENCOUNTER — Encounter (HOSPITAL_COMMUNITY): Payer: Self-pay | Admitting: *Deleted

## 2019-11-19 ENCOUNTER — Other Ambulatory Visit: Payer: Self-pay

## 2019-11-19 ENCOUNTER — Emergency Department (HOSPITAL_COMMUNITY)
Admission: EM | Admit: 2019-11-19 | Discharge: 2019-11-19 | Disposition: A | Discharge status: Home / Self Care | Payer: Self-pay | Attending: Emergency Medicine | Admitting: Emergency Medicine

## 2019-11-19 DIAGNOSIS — Y9301 Activity, walking, marching and hiking: Secondary | ICD-10-CM | POA: Insufficient documentation

## 2019-11-19 DIAGNOSIS — G35 Multiple sclerosis: Secondary | ICD-10-CM | POA: Insufficient documentation

## 2019-11-19 DIAGNOSIS — W010XXA Fall on same level from slipping, tripping and stumbling without subsequent striking against object, initial encounter: Secondary | ICD-10-CM | POA: Insufficient documentation

## 2019-11-19 DIAGNOSIS — Z79899 Other long term (current) drug therapy: Secondary | ICD-10-CM | POA: Insufficient documentation

## 2019-11-19 DIAGNOSIS — F17210 Nicotine dependence, cigarettes, uncomplicated: Secondary | ICD-10-CM | POA: Insufficient documentation

## 2019-11-19 DIAGNOSIS — Y990 Civilian activity done for income or pay: Secondary | ICD-10-CM | POA: Insufficient documentation

## 2019-11-19 DIAGNOSIS — Y9259 Other trade areas as the place of occurrence of the external cause: Secondary | ICD-10-CM | POA: Insufficient documentation

## 2019-11-19 DIAGNOSIS — M545 Low back pain, unspecified: Secondary | ICD-10-CM

## 2019-11-19 DIAGNOSIS — W19XXXA Unspecified fall, initial encounter: Secondary | ICD-10-CM

## 2019-11-19 MED ORDER — LIDOCAINE 5 % EX PTCH: 1.0000 | MEDICATED_PATCH | CUTANEOUS | 0 refills | Status: AC

## 2019-11-19 MED ORDER — NAPROXEN 500 MG PO TABS: 500.0000 mg | ORAL_TABLET | Freq: Two times a day (BID) | ORAL | 0 refills | Status: DC

## 2019-11-19 MED ORDER — HYDROCODONE-ACETAMINOPHEN 5-325 MG PO TABS
1.0000 | ORAL_TABLET | Freq: Once | ORAL | Status: AC
  Administered 2019-11-19: 1 via ORAL
  Filled 2019-11-19: qty 1

## 2019-11-19 NOTE — ED Triage Notes (Signed)
Pt slipped and fell, denies hitting his head.  Landed on bottom, pt feels as he reached out to break fall caused him to make his back hurt.  Pt states with hx of back pain .

## 2019-11-19 NOTE — ED Provider Notes (Signed)
[facility] EMERGENCY DEPARTMENT Provider Note   CSN: [id number] Arrival date & time: 11/19/19  1137    History Chief Complaint  Patient presents with   Jesse Gibbs is a 39 y.o. male with past medical history for MS, sacral fracture who presents for evaluation of back pain.  Patient states he had a mechanical trip and fall walking into work today.  He landed on gravel.  He denies hitting his head, LOC or anticoagulation.  Patient with midline lumbar and some left paraspinal muscle tenderness since the fall.  He denies any IV drug use, bowel or bladder incontinence, saddle paresthesia, history of malignancy.  Nuys any pain to extremities.  He has not take anything for his pain.  He rates his current pain a 9/10.  Pain does not radiate into his legs.  Denies fever, chills, nausea, vomiting, chest pain, shortness of breath abdominal pain, decreased range of motion, numbness or tingling to extremities, redness, swelling, warmth to extremities.  Denies additional aggravating or alleviating factors. Pain worse with movement especially twisting, bending.  History obtained from patient and past medical records.  No interpreter is used.  HPI     Past Medical History:  Diagnosis Date   MS (multiple sclerosis) ([facility])    Sacral fracture ([facility])     There are no problems to display for this patient.   History reviewed. No pertinent surgical history.     History reviewed. No pertinent family history.  Social History   Tobacco Use   Smoking status: Current Every Day Smoker    Packs/day: 1.00    Types: Cigarettes   Smokeless tobacco: Never Used  Substance Use Topics   Alcohol use: Yes    Alcohol/week: 28.0 standard drinks    Types: 28 Cans of beer per week    Comment: 3-4 beers nightly    Drug use: No    Home Medications Prior to Admission medications   Medication Sig Start Date End Date Taking? Authorizing Provider  amoxicillin (AMOXIL) 500 MG capsule Take 1  capsule (500 mg total) by mouth 3 (three) times daily. 07/08/14   Eber Hong, MD  HYDROcodone-acetaminophen (NORCO/VICODIN) 5-325 MG per tablet Take 2 tablets by mouth every 4 (four) hours as needed. 07/08/14   Eber Hong, MD  lidocaine (LIDODERM) 5 % Place 1 patch onto the skin daily. Remove & Discard patch within 12 hours or as directed by MD 11/19/19   Bethann Qualley A, PA-C  methocarbamol (ROBAXIN-750) 750 MG tablet Take 1 tablet (750 mg total) by mouth 4 (four) times daily. 03/02/19   Burgess Amor, PA-C  naproxen (NAPROSYN) 500 MG tablet Take 1 tablet (500 mg total) by mouth 2 (two) times daily. 11/19/19   Eustace Hur A, PA-C    Allergies    Patient has no known allergies.  Review of Systems   Review of Systems  Constitutional: Negative.   HENT: Negative.   Respiratory: Negative.   Cardiovascular: Negative.   Gastrointestinal: Negative.   Genitourinary: Negative.   Musculoskeletal: Positive for back pain.  Skin: Negative.   Neurological: Negative.   All other systems reviewed and are negative.   Physical Exam Updated Vital Signs BP 118/73 (BP Location: Right Arm)    Pulse (!) 118    Temp 98.2 F (36.8 C) (Oral)    Resp 18    Ht 5\' 8"  (1.727 m)    Wt 70.3 kg    SpO2 94%    BMI 23.57 kg/m  Physical Exam Physical Exam  Constitutional: Pt appears well-developed and well-nourished. No distress.  HENT:  Head: Normocephalic and atraumatic.  Mouth/Throat: Oropharynx is clear and moist. No oropharyngeal exudate.  Eyes: Conjunctivae are normal.  Neck: Normal range of motion. Neck supple.  Full ROM without pain  Cardiovascular: Normal rate, regular rhythm and intact distal pulses.   Pulmonary/Chest: Effort normal and breath sounds normal. No respiratory distress. Pt has no wheezes.  Abdominal: Soft. Pt exhibits no distension. There is no tenderness, rebound or guarding. No abd bruit or pulsatile mass Musculoskeletal:  Full range of motion of the T-spine and L-spine with  flexion, hyperextension, and lateral flexion. No midline tenderness or stepoffs. No tenderness to palpation of the spinous processes of the T-spine or L-spine. Mild tenderness to palpation of the paraspinous muscles of the L-spine L>R. negative straight leg raise. Lymphadenopathy:    Pt has no cervical adenopathy.  Neurological: Pt is alert. Pt has normal reflexes.  Reflex Scores:      Bicep reflexes are 2+ on the right side and 2+ on the left side.      Brachioradialis reflexes are 2+ on the right side and 2+ on the left side.      Patellar reflexes are 2+ on the right side and 2+ on the left side.      Achilles reflexes are 2+ on the right side and 2+ on the left side. Speech is clear and goal oriented, follows commands Normal 5/5 strength in upper and lower extremities bilaterally including dorsiflexion and plantar flexion, strong and equal grip strength Sensation normal to light and sharp touch Moves extremities without ataxia, coordination intact Normal gait Normal balance No Clonus Skin: Skin is warm and dry. No rash noted or lesions noted. Pt is not diaphoretic. No erythema, ecchymosis,edema or warmth.  Psychiatric: Pt has a normal mood and affect. Behavior is normal.  Nursing note and vitals reviewed. ED Results / Procedures / Treatments   Labs (all labs ordered are listed, but only abnormal results are displayed) Labs Reviewed - No data to display  EKG None  Radiology DG Lumbar Spine Complete  Result Date: 11/19/2019 CLINICAL DATA:  Fall, back pain. EXAM: LUMBAR SPINE - COMPLETE 4+ VIEW COMPARISON:  Lumbar spine MRI 04/08/2017, report from lumbar spine radiographs 03/29/2017 (images unavailable) FINDINGS: 5 lumbar vertebrae. Mild lumbar dextrocurvature. No significant spondylolisthesis. No lumbar compression deformity. Multilevel disc degeneration and degenerative endplate irregularity. Most notably, there is moderate disc space narrowing at L5-S1. Minimal facet arthrosis  within the lower lumbar spine. No convincing pars interarticularis defect on oblique radiographs. IMPRESSION: No lumbar compression fracture. Lumbar spondylosis as described and greatest at L5-S1. Mild lumbar dextrocurvature. Electronically Signed   By: Kellie Simmering DO   On: 11/19/2019 12:44    Procedures Procedures (including critical care time)  Medications Ordered in ED Medications  HYDROcodone-acetaminophen (NORCO/VICODIN) 5-325 MG per tablet 1 tablet (1 tablet Oral Given 11/19/19 1225)    ED Course  I have reviewed the triage vital signs and the nursing notes.  Pertinent labs & imaging results that were available during my care of the patient were reviewed by me and considered in my medical decision making (see chart for details).  31 old male peers otherwise well presents for evaluation after mechanical fall.  Patient afebrile, nonseptic, non-ill-appearing.  Patient with midline lumbar back pain left paraspinal muscle pain after mechanical fall earlier today.  He denies hitting his head, LOC or anticoagulation.  No red flags for back pain,  specifically no IV drug use, bowel or bladder incontinence, saddle paresthesia, history malignancy.  History of chronic back pain which he takes pain medication for.  He is ambulatory with out difficulty.  Normal musculoskeletal exam.  Neurovascularly intact.  Pain reproducible to palpation to left paraspinal muscles and left lumbar however no crepitus, step-offs.  No overlying skin changes.  Will obtain x-ray, pain management.  Dg lumbar with no acute changes.  Patient likely with musculoskeletal pain.  Initially tachycardic at 118 in room however my initial evaluation patient's heart rate in the 80s.  Discussed taking his home pain medicine which she uses for chronic pain as well as additional lidocaine patches and naproxen.  Low suspicion for cauda equina, discitis, osteomyelitis, transverse myelitis, acute compression fracture, bacterial infection.  He is  to follow-up outpatient with his pain management provider or orthopedics for any new worsening pain  The patient has been appropriately medically screened and/or stabilized in the ED. I have low suspicion for any other emergent medical condition which would require further screening, evaluation or treatment in the ED or require inpatient management.  Patient is hemodynamically stable and in no acute distress.  Patient able to ambulate in department prior to ED.  Evaluation does not show acute pathology that would require ongoing or additional emergent interventions while in the emergency department or further inpatient treatment.  I have discussed the diagnosis with the patient and answered all questions.  Pain is been managed while in the emergency department and patient has no further complaints prior to discharge.  Patient is comfortable with plan discussed in room and is stable for discharge at this time.  I have discussed strict return precautions for returning to the emergency department.  Patient was encouraged to follow-up with PCP/specialist refer to at discharge.    MDM Rules/Calculators/A&P                       Final Clinical Impression(s) / ED Diagnoses Final diagnoses:  Fall, initial encounter  Acute left-sided low back pain without sciatica    Rx / DC Orders ED Discharge Orders         Ordered    lidocaine (LIDODERM) 5 %  Every 24 hours     11/19/19 1311    naproxen (NAPROSYN) 500 MG tablet  2 times daily     11/19/19 1311           Kimsey Demaree A, PA-C 11/19/19 1312    Bethann Berkshire, MD 11/19/19 1345

## 2019-11-19 NOTE — Discharge Instructions (Signed)
Take your home pain medicines as well as additional anti-inflammatories and pain patches.  Follow-up with orthopedics if symptoms unresolved

## 2020-11-22 IMAGING — DX DG LUMBAR SPINE COMPLETE 4+V
5 series · 5 of 5 positions shown · non-contrast
Comparison: Lumbar spine MRI 04/08/2017, report from lumbar spine
radiographs 03/29/2017 (images unavailable)

CLINICAL DATA: Fall, back pain.

EXAM:
LUMBAR SPINE - COMPLETE 4+ VIEW

[l-spine ap]
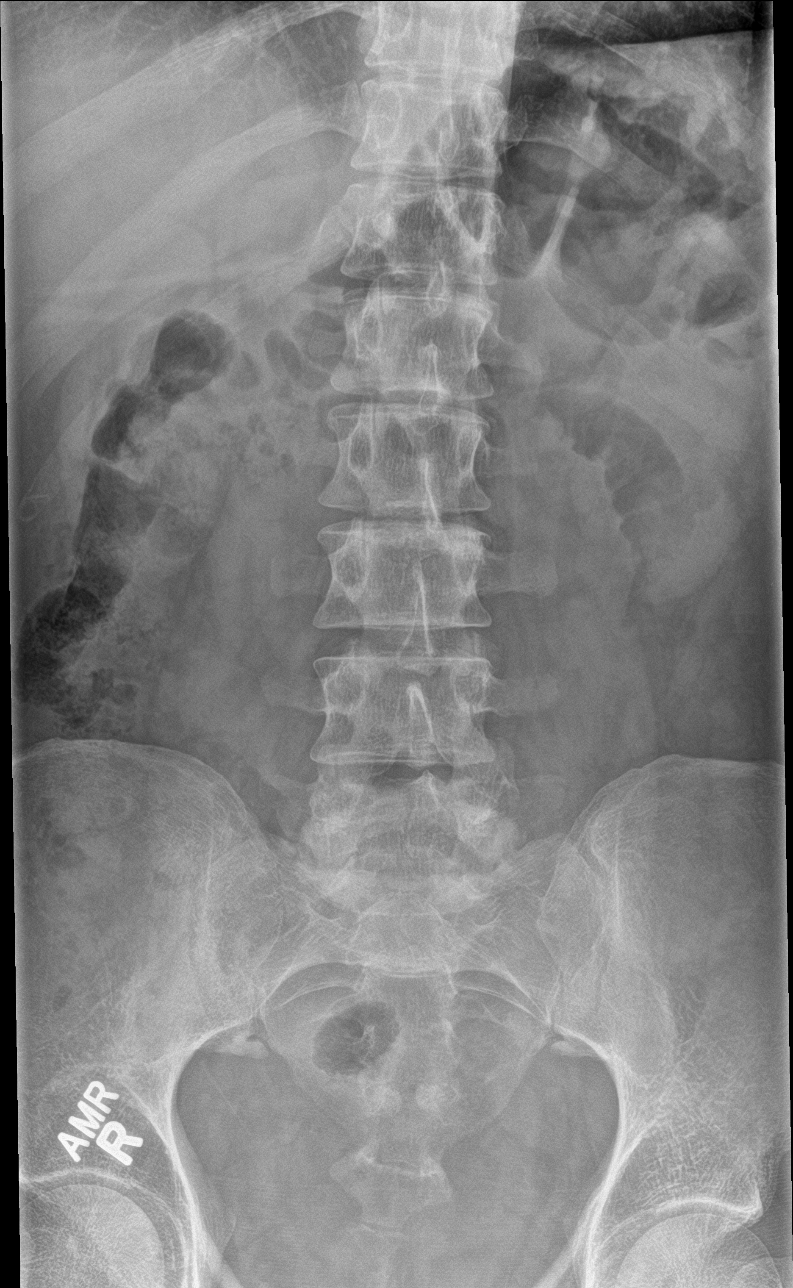

[l-spine obl (1 of 2)]
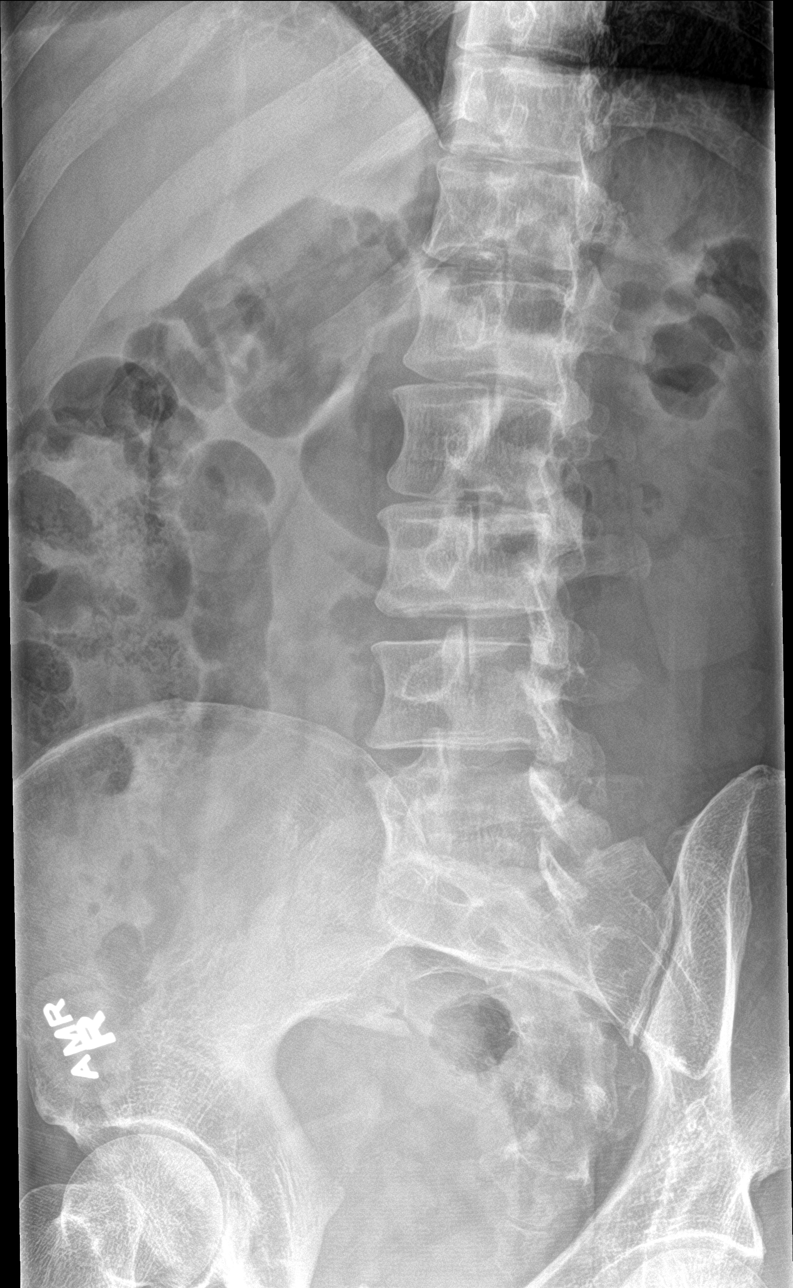

[l-spine obl (2 of 2)]
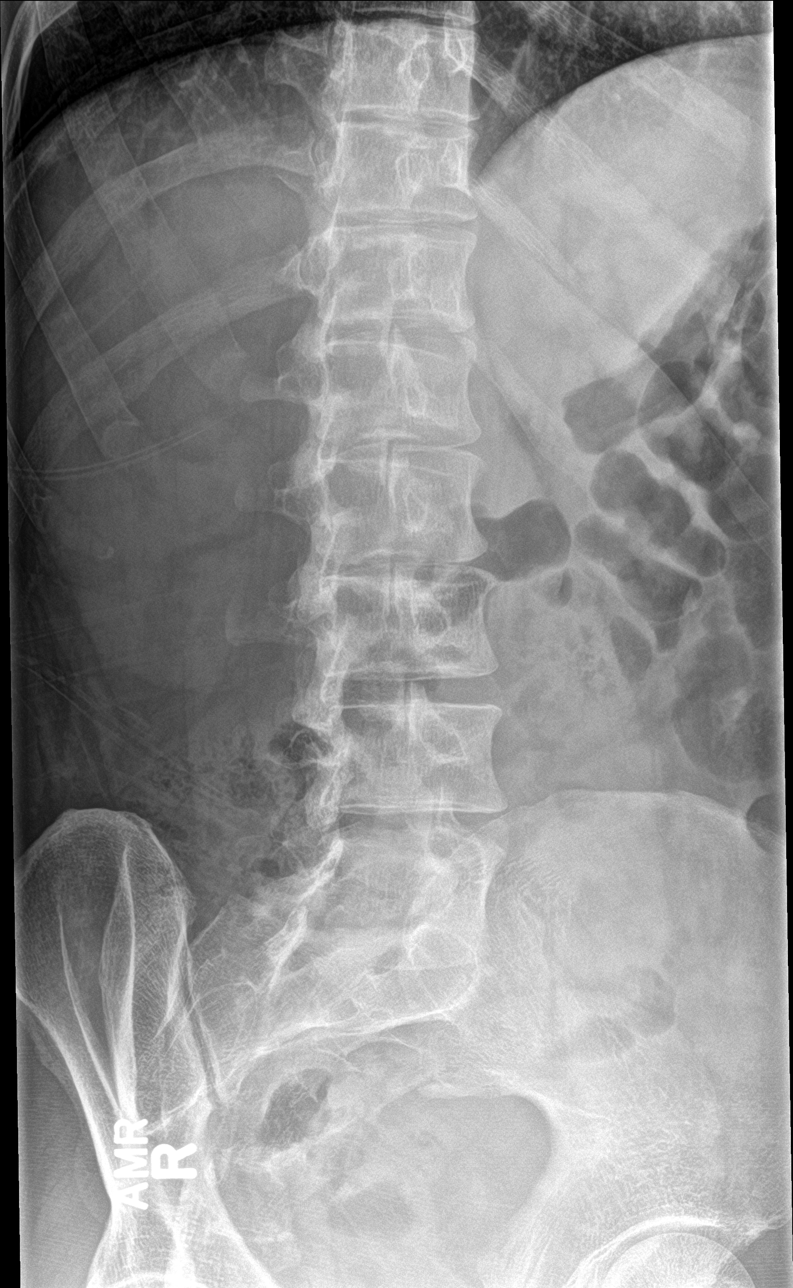

[l-spine lat]
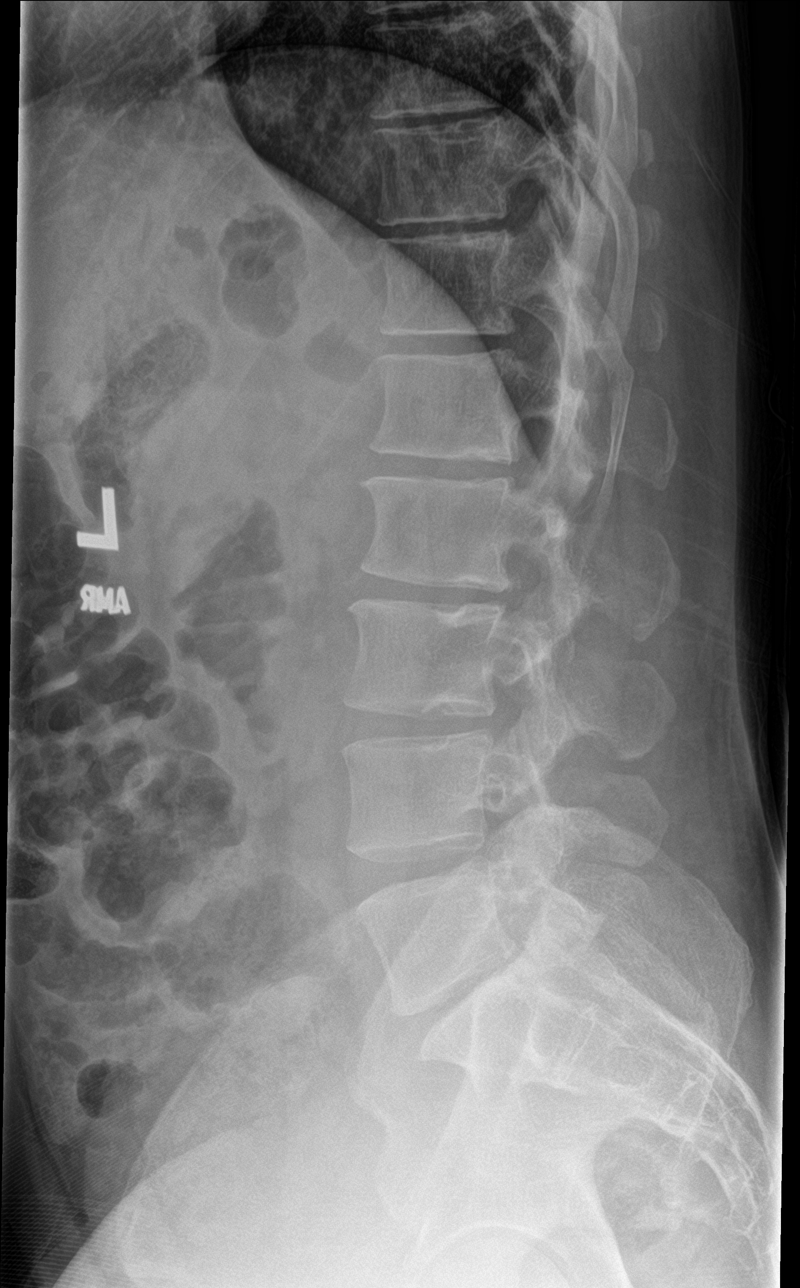

[l-spine spot]
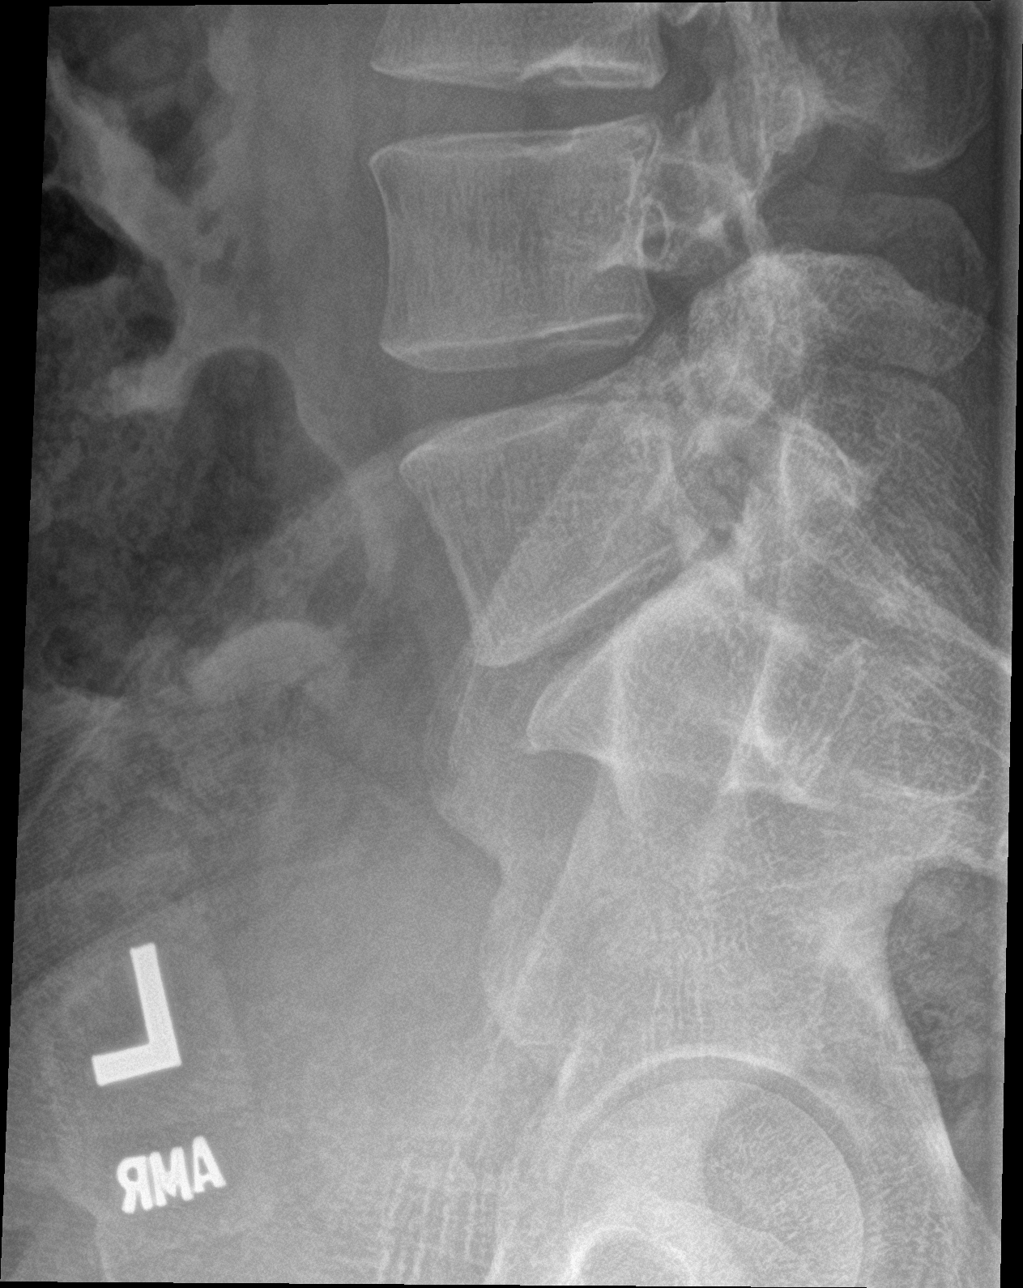

[5 of 5 positions shown; findings below may reference images not displayed]

FINDINGS: 5 lumbar vertebrae. Mild lumbar dextrocurvature. No significant
spondylolisthesis. No lumbar compression deformity. Multilevel disc
degeneration and degenerative endplate irregularity. Most notably,
there is moderate disc space narrowing at L5-S1. Minimal facet
arthrosis within the lower lumbar spine. No convincing pars
interarticularis defect on oblique radiographs.
IMPRESSION: No lumbar compression fracture.

Lumbar spondylosis as described and greatest at L5-S1.

Mild lumbar dextrocurvature.

## 2021-05-01 ENCOUNTER — Telehealth: Payer: Self-pay

## 2021-05-01 NOTE — Telephone Encounter (Signed)
Per Dr Allena Katz - [facility] will not accept this pt for Primary Care 05/01/21 - LM with room mate for patient to call office.  I did not give the information to the room mate. Ma 05/01/21

## 2021-06-03 ENCOUNTER — Encounter (HOSPITAL_COMMUNITY): Payer: Self-pay

## 2021-06-03 ENCOUNTER — Emergency Department (HOSPITAL_COMMUNITY)
Admission: EM | Admit: 2021-06-03 | Discharge: 2021-06-03 | Disposition: A | Discharge status: Home / Self Care | Payer: Self-pay | Attending: Emergency Medicine | Admitting: Emergency Medicine

## 2021-06-03 ENCOUNTER — Other Ambulatory Visit: Payer: Self-pay

## 2021-06-03 DIAGNOSIS — G8929 Other chronic pain: Secondary | ICD-10-CM | POA: Insufficient documentation

## 2021-06-03 DIAGNOSIS — M545 Low back pain, unspecified: Secondary | ICD-10-CM | POA: Insufficient documentation

## 2021-06-03 DIAGNOSIS — F17210 Nicotine dependence, cigarettes, uncomplicated: Secondary | ICD-10-CM | POA: Insufficient documentation

## 2021-06-03 MED ORDER — HYDROCODONE-ACETAMINOPHEN 5-325 MG PO TABS: 2.0000 | ORAL_TABLET | ORAL | 0 refills | Status: DC | PRN

## 2021-06-03 NOTE — ED Provider Notes (Signed)
[facility] EMERGENCY DEPARTMENT Provider Note   CSN: [id number] Arrival date & time: 06/03/21  1538     History Chief Complaint  Patient presents with   Back Pain    Jesse Gibbs is a 40 y.o. male.  Presents with acute exacerbation of his chronic back pain.  He states he had it for years, typically gets oxycodone from his primary care doctor but he states his doctor retired and he had a hard time finding a new doctor.  He ran out of medications about 5 days ago.  He was at work when he had worsening pain in the last 3 days described as left-sided lower back pain rating down his left leg described as sharp and aching 8 out of 10 in severity.  Denies fevers or cough.  Denies vomiting or diarrhea.  Denies any new numbness or weakness.  Denies bowel or bladder dysfunction.      Past Medical History:  Diagnosis Date   MS (multiple sclerosis) ([facility])    Sacral fracture ([facility])     There are no problems to display for this patient.   History reviewed. No pertinent surgical history.     History reviewed. No pertinent family history.  Social History   Tobacco Use   Smoking status: Every Day    Packs/day: 1.00    Types: Cigarettes   Smokeless tobacco: Never  Vaping Use   Vaping Use: Never used  Substance Use Topics   Alcohol use: Yes    Alcohol/week: 28.0 standard drinks    Types: 28 Cans of beer per week    Comment: 3-4 beers nightly    Drug use: No    Home Medications Prior to Admission medications   Medication Sig Start Date End Date Taking? Authorizing Provider  amoxicillin (AMOXIL) 500 MG capsule Take 1 capsule (500 mg total) by mouth 3 (three) times daily. 07/08/14   Eber Hildegard Hlavac, MD  HYDROcodone-acetaminophen (NORCO/VICODIN) 5-325 MG tablet Take 2 tablets by mouth every 4 (four) hours as needed for up to 8 doses. 06/03/21   Cheryll Cockayne, MD  lidocaine (LIDODERM) 5 % Place 1 patch onto the skin daily. Remove & Discard patch within 12 hours or as directed by MD  11/19/19   Henderly, Britni A, PA-C  methocarbamol (ROBAXIN-750) 750 MG tablet Take 1 tablet (750 mg total) by mouth 4 (four) times daily. 03/02/19   Burgess Amor, PA-C  naproxen (NAPROSYN) 500 MG tablet Take 1 tablet (500 mg total) by mouth 2 (two) times daily. 11/19/19   Henderly, Britni A, PA-C    Allergies    Patient has no known allergies.  Review of Systems   Review of Systems  Constitutional:  Negative for fever.  HENT:  Negative for ear pain and sore throat.   Eyes:  Negative for pain.  Respiratory:  Negative for cough.   Cardiovascular:  Negative for chest pain.  Gastrointestinal:  Negative for abdominal pain.  Genitourinary:  Negative for flank pain.  Musculoskeletal:  Positive for back pain.  Skin:  Negative for color change and rash.  Neurological:  Negative for syncope.  All other systems reviewed and are negative.  Physical Exam Updated Vital Signs BP (!) 130/56 (BP Location: Right Arm)   Pulse 98   Temp 98.1 F (36.7 C) (Oral)   Resp 20   Ht 5\' 8"  (1.727 m)   Wt 72.8 kg   SpO2 93%   BMI 24.42 kg/m   Physical Exam Constitutional:  Appearance: He is well-developed.  HENT:     Head: Normocephalic.     Nose: Nose normal.  Eyes:     Extraocular Movements: Extraocular movements intact.  Cardiovascular:     Rate and Rhythm: Normal rate.  Pulmonary:     Effort: Pulmonary effort is normal.  Skin:    Coloration: Skin is not jaundiced.  Neurological:     General: No focal deficit present.     Mental Status: He is alert and oriented to person, place, and time. Mental status is at baseline.     Cranial Nerves: No cranial nerve deficit.     Motor: No weakness.     Gait: Gait normal.    ED Results / Procedures / Treatments   Labs (all labs ordered are listed, but only abnormal results are displayed) Labs Reviewed - No data to display  EKG None  Radiology No results found.  Procedures Procedures   Medications Ordered in ED Medications - No data to  display  ED Course  I have reviewed the triage vital signs and the nursing notes.  Pertinent labs & imaging results that were available during my care of the patient were reviewed by me and considered in my medical decision making (see chart for details).    MDM Rules/Calculators/A&P                           Patient will be given a refill of his pain medications.  Advised outpatient follow-up with a primary care doctor within the week.  Given resources to call for appointments.  Advised return if he has fevers new numbness weakness or any additional concerns.  Final Clinical Impression(s) / ED Diagnoses Final diagnoses:  Chronic left-sided low back pain without sciatica    Rx / DC Orders ED Discharge Orders          Ordered    HYDROcodone-acetaminophen (NORCO/VICODIN) 5-325 MG tablet  Every 4 hours PRN        06/03/21 1818             Cheryll Cockayne, MD 06/03/21 1818

## 2021-06-03 NOTE — ED Triage Notes (Signed)
Pt to er, pt c/o L lower back pain, states that the pain burns into his l hip, pt states that the pain started when he got up a couple of days ago, states that it hurts more when he doesn't have weight on his leg.

## 2021-06-03 NOTE — Discharge Instructions (Signed)
Call your primary care doctor or specialist as discussed in the next 2-3 days.   Return immediately back to the ER if:  Your symptoms worsen within the next 12-24 hours. You develop new symptoms such as new fevers, persistent vomiting, new pain, shortness of breath, or new weakness or numbness, or if you have any other concerns.  

## 2022-09-21 ENCOUNTER — Emergency Department (HOSPITAL_COMMUNITY)
Admission: EM | Admit: 2022-09-21 | Discharge: 2022-09-21 | Disposition: A | Discharge status: Home / Self Care | Payer: Self-pay | Attending: Emergency Medicine | Admitting: Emergency Medicine

## 2022-09-21 ENCOUNTER — Other Ambulatory Visit: Payer: Self-pay

## 2022-09-21 ENCOUNTER — Encounter (HOSPITAL_COMMUNITY): Payer: Self-pay | Admitting: Emergency Medicine

## 2022-09-21 DIAGNOSIS — S40861A Insect bite (nonvenomous) of right upper arm, initial encounter: Secondary | ICD-10-CM | POA: Insufficient documentation

## 2022-09-21 DIAGNOSIS — W57XXXA Bitten or stung by nonvenomous insect and other nonvenomous arthropods, initial encounter: Secondary | ICD-10-CM | POA: Insufficient documentation

## 2022-09-21 MED ORDER — DOXYCYCLINE HYCLATE 100 MG PO CAPS: 100.0000 mg | ORAL_CAPSULE | Freq: Two times a day (BID) | ORAL | 0 refills | Status: AC

## 2022-09-21 MED ORDER — IBUPROFEN 800 MG PO TABS
800.0000 mg | ORAL_TABLET | Freq: Once | ORAL | Status: AC
  Administered 2022-09-21: 800 mg via ORAL
  Filled 2022-09-21: qty 1

## 2022-09-21 MED ORDER — DOXYCYCLINE HYCLATE 100 MG PO TABS
100.0000 mg | ORAL_TABLET | Freq: Once | ORAL | Status: AC
  Administered 2022-09-21: 100 mg via ORAL
  Filled 2022-09-21: qty 1

## 2022-09-21 NOTE — ED Provider Notes (Signed)
[facility] EMERGENCY DEPARTMENT Provider Note   CSN: [id number] Arrival date & time: 09/21/22  1252     History  Chief Complaint  Patient presents with   Insect Bite    Jesse Gibbs is a 42 y.o. male.  HPI      Jesse Gibbs is a 42 y.o. male who presents to the Emergency Department complaining of generalized body ache, and recent tick bite.  He states he removed the tick from his right upper arm 3 days ago.  Initially, he denied having any symptoms but began having generalized body aches yesterday.  No known fever at home, rash, headache, dizziness nausea or vomiting.  No specific joint pains.  No known sick contacts.   Home Medications Prior to Admission medications   Medication Sig Start Date End Date Taking? Authorizing Provider  amoxicillin (AMOXIL) 500 MG capsule Take 1 capsule (500 mg total) by mouth 3 (three) times daily. 07/08/14   Noemi Chapel, MD  HYDROcodone-acetaminophen (NORCO/VICODIN) 5-325 MG tablet Take 2 tablets by mouth every 4 (four) hours as needed for up to 8 doses. 06/03/21   Luna Fuse, MD  lidocaine (LIDODERM) 5 % Place 1 patch onto the skin daily. Remove & Discard patch within 12 hours or as directed by MD 11/19/19   Henderly, Britni A, PA-C  methocarbamol (ROBAXIN-750) 750 MG tablet Take 1 tablet (750 mg total) by mouth 4 (four) times daily. 03/02/19   Evalee Jefferson, PA-C  naproxen (NAPROSYN) 500 MG tablet Take 1 tablet (500 mg total) by mouth 2 (two) times daily. 11/19/19   Henderly, Britni A, PA-C      Allergies    Patient has no known allergies.    Review of Systems   Review of Systems  Constitutional:  Positive for chills. Negative for appetite change and fever.  HENT:  Negative for congestion and sore throat.   Respiratory:  Negative for cough and shortness of breath.   Cardiovascular:  Negative for chest pain.  Gastrointestinal:  Negative for abdominal pain, diarrhea, nausea and vomiting.  Genitourinary:  Negative for difficulty urinating  and dysuria.  Musculoskeletal:  Positive for myalgias. Negative for neck pain and neck stiffness.  Skin:  Negative for rash.       Tick bite right upper arm  Neurological:  Negative for weakness and numbness.    Physical Exam Updated Vital Signs BP (!) 145/66 (BP Location: Right Arm)   Pulse 100   Temp 98.1 F (36.7 C) (Oral)   Resp 17   Ht 5' 6.5" (1.689 m)   Wt 77.1 kg   SpO2 98%   BMI 27.03 kg/m  Physical Exam Vitals and nursing note reviewed.  Constitutional:      General: He is not in acute distress.    Appearance: Normal appearance. He is not toxic-appearing.  HENT:     Mouth/Throat:     Mouth: Mucous membranes are moist.     Pharynx: No posterior oropharyngeal erythema.  Cardiovascular:     Rate and Rhythm: Normal rate and regular rhythm.     Pulses: Normal pulses.  Pulmonary:     Effort: Pulmonary effort is normal.  Musculoskeletal:        General: Normal range of motion.     Cervical back: Normal range of motion. No rigidity.  Lymphadenopathy:     Cervical: No cervical adenopathy.  Skin:    General: Skin is warm.     Capillary Refill: Capillary refill takes less than 2 seconds.  Comments: 77mm area of central scab with 1 cm surrounding erythema.  Surrounding erythema is minimal with out induration or edema.  No purulent drainage noted no lymphangitis.  Neurological:     General: No focal deficit present.     Mental Status: He is alert.     Sensory: No sensory deficit.     Motor: No weakness.     ED Results / Procedures / Treatments   Labs (all labs ordered are listed, but only abnormal results are displayed) Labs Reviewed - No data to display  EKG None  Radiology No results found.  Procedures Procedures    Medications Ordered in ED Medications - No data to display  ED Course/ Medical Decision Making/ A&P                           Medical Decision Making Patient here with generalized bodyaches and recent tick bite.  Tick was removed by  spouse with no reported remaining tick parts.  Tick was removed 3 days ago, began having generalized body aches yesterday.  No known fever.  No specific joint pains, rash nausea or vomiting.  On my exam, localized area of erythema with central scab, no lymphangi or target lesion.  Patient has reassuring vital signs he is nontoxic-appearing.  Patient symptoms may be related to recent tick bite, he may also have early viral process.  He does not feel that he has flulike symptoms, specifically denying any significant cough shortness of breath or fever.  Discussed workup, patient agreeable to treatment with doxycycline.  He will alternate Tylenol and ibuprofen for his symptoms.  Amount and/or Complexity of Data Reviewed Discussion of management or test interpretation with external provider(s): Patient with tick bite, will treat with doxycycline.  He appears appropriate for discharge home.  Turn precautions were discussed.  No indication for imaging or laboratory studies at this time.           Final Clinical Impression(s) / ED Diagnoses Final diagnoses:  Tick bite of right upper arm, initial encounter    Rx / DC Orders ED Discharge Orders     None         Kem Parkinson, PA-C 09/21/22 1800    Hayden Rasmussen, MD 09/22/22 1151

## 2022-09-21 NOTE — ED Triage Notes (Signed)
Pt reports he had a tick on him x 2-3 days ago that he removed, pt reports generalized pain and feeling bad since he removed it, denies fever/rash

## 2022-09-21 NOTE — Discharge Instructions (Signed)
You have been prescribed an antibiotic called doxycycline to take for your recent tick bite.  I recommend that you take as directed until it is finished.  You may also alternate Tylenol and ibuprofen every 4 and 6 hours respectively for fever and/or bodyaches.  Please follow-up with your primary care provider for recheck return to the emergency department for any new or worsening symptoms.

## 2023-10-15 ENCOUNTER — Other Ambulatory Visit: Payer: Self-pay

## 2023-10-15 ENCOUNTER — Emergency Department (HOSPITAL_COMMUNITY)
Admission: EM | Admit: 2023-10-15 | Discharge: 2023-10-15 | Disposition: A | Discharge status: Home / Self Care | Payer: Self-pay | Attending: Emergency Medicine | Admitting: Emergency Medicine

## 2023-10-15 ENCOUNTER — Encounter (HOSPITAL_COMMUNITY): Payer: Self-pay

## 2023-10-15 DIAGNOSIS — B353 Tinea pedis: Secondary | ICD-10-CM | POA: Insufficient documentation

## 2023-10-15 DIAGNOSIS — L853 Xerosis cutis: Secondary | ICD-10-CM | POA: Insufficient documentation

## 2023-10-15 DIAGNOSIS — R7309 Other abnormal glucose: Secondary | ICD-10-CM | POA: Insufficient documentation

## 2023-10-15 HISTORY — DX: Cellulitis, unspecified: L03.90

## 2023-10-15 LAB — CBG MONITORING, ED: Glucose-Capillary: 78 mg/dL (ref 70–99)

## 2023-10-15 MED ORDER — IBUPROFEN 400 MG PO TABS
600.0000 mg | ORAL_TABLET | Freq: Once | ORAL | Status: AC
  Administered 2023-10-15: 600 mg via ORAL
  Filled 2023-10-15: qty 2

## 2023-10-15 MED ORDER — TOLNAFTATE 1 % EX AERP: INHALATION_SPRAY | Freq: Two times a day (BID) | CUTANEOUS | 0 refills | Status: AC

## 2023-10-15 MED ORDER — DOUBLE ANTIBIOTIC 500-10000 UNIT/GM EX OINT
TOPICAL_OINTMENT | Freq: Once | CUTANEOUS | Status: AC
  Administered 2023-10-15: 1 via TOPICAL
  Filled 2023-10-15: qty 1

## 2023-10-15 NOTE — ED Notes (Signed)
Pt reports he works on his feet a lot in [employer], and his boots are in bad condition. Pt unsure if has some a form of foot fungus.

## 2023-10-15 NOTE — ED Triage Notes (Signed)
Pt arrived via POV c/o bilateral foot pain X 1 week. Pt reports it feels like both of his feet crack, split open, as "if the skin is tearing apart." Pt denies an injury.

## 2023-10-15 NOTE — ED Provider Notes (Signed)
[place] EMERGENCY DEPARTMENT AT [facility] Provider Note   CSN: [id number] Arrival date & time: 10/15/23  1455     History  Chief Complaint  Patient presents with   Foot Pain    Jesse Gibbs is a 43 y.o. male.  Presents the ER today for pain in bilateral feet with cracked skin.  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  He had to miss work today because of this.  He states the pain gets better and swelling goes away when he rests but when he walks around in his boots he gets worse again.  Denies bleeding, denies fevers or chills, no drainage.  He states if he is up on his feet they get somewhat red but then it goes away with rest and elevation.  He denies any chronic medical conditions.   Foot Pain       Home Medications Prior to Admission medications   Medication Sig Start Date End Date Taking? Authorizing Provider  tolnaftate (TINACTIN) 1 % spray Apply topically 2 (two) times daily. 10/15/23  Yes Damien Cisar A, PA-C  doxycycline (VIBRAMYCIN) 100 MG capsule Take 1 capsule (100 mg total) by mouth 2 (two) times daily. 09/21/22   Triplett, Tammy, PA-C  HYDROcodone-acetaminophen (NORCO/VICODIN) 5-325 MG tablet Take 2 tablets by mouth every 4 (four) hours as needed for up to 8 doses. 06/03/21   Cheryll Cockayne, MD  lidocaine (LIDODERM) 5 % Place 1 patch onto the skin daily. Remove & Discard patch within 12 hours or as directed by MD 11/19/19   Henderly, Britni A, PA-C  methocarbamol (ROBAXIN-750) 750 MG tablet Take 1 tablet (750 mg total) by mouth 4 (four) times daily. 03/02/19   Burgess Amor, PA-C  naproxen (NAPROSYN) 500 MG tablet Take 1 tablet (500 mg total) by mouth 2 (two) times daily. 11/19/19   Henderly, Britni A, PA-C      Allergies    Patient has no known allergies.    Review of Systems   Review of Systems  Physical Exam Updated Vital Signs BP  122/83 (BP Location: Right Arm)   Pulse 69   Temp 98 F (36.7 C) (Oral)   Resp 16   Ht 5' 6.5" (1.689 m)   Wt 77 kg   SpO2 98%   BMI 26.99 kg/m  Physical Exam Vitals and nursing note reviewed.  Constitutional:      General: He is not in acute distress.    Appearance: He is well-developed.  HENT:     Head: Normocephalic and atraumatic.     Mouth/Throat:     Mouth: Mucous membranes are moist.  Eyes:     Extraocular Movements: Extraocular movements intact.     Conjunctiva/sclera: Conjunctivae normal.     Pupils: Pupils are equal, round, and reactive to light.  Cardiovascular:     Rate and Rhythm: Normal rate and regular rhythm.     Heart sounds: No murmur heard. Pulmonary:     Effort: Pulmonary effort is normal. No respiratory distress.     Breath sounds: Normal breath sounds.  Abdominal:     Palpations: Abdomen is soft.     Tenderness: There is no abdominal tenderness.  Musculoskeletal:        General: No swelling.     Cervical back: Neck supple.     Right lower leg: No edema.  Left lower leg: No edema.  Skin:    General: Skin is warm and dry.     Capillary Refill: Capillary refill takes less than 2 seconds.     Comments: There is cracking of the skin of bilateral feet on the soles with no bleeding, no redness, no fluctuance, no induration.  There is some skin breakdown between toes of third fourth and fifth digits bilaterally as well.  Neurological:     General: No focal deficit present.     Mental Status: He is alert and oriented to person, place, and time.  Psychiatric:        Mood and Affect: Mood normal.        Behavior: Behavior normal.     ED Results / Procedures / Treatments   Labs (all labs ordered are listed, but only abnormal results are displayed) Labs Reviewed  CBG MONITORING, ED    EKG None  Radiology No results found.  Procedures Procedures    Medications Ordered in ED Medications  ibuprofen (ADVIL) tablet 600 mg (600 mg Oral Given  10/15/23 1813)  polymixin-bacitracin (POLYSPORIN) ointment (1 Application Topical Given 10/15/23 1814)    ED Course/ Medical Decision Making/ A&P                                 Medical Decision Making DDx: Cellulitis, abscess, contact dermatitis, tinea pedis, other  Course: Patient having full cracked skin bilateral feet in the setting of feet being in wet environment.  He is already changing his socks at lunchtime, encouraged him to switch from dark-colored socks to white socks.  Encouraged applying Aquaphor to the dry, cracked areas at night and bandaging them for padding, wear padded shoes.  He does seem to have element of tinea pedis between his toes so given topical medication for this.  Advised on follow-up and strict return precautions.  There is no sign of cellulitis, he has a history of diabetes and blood sugar today is normal.  Advised on follow-up and return precautions.  Amount and/or Complexity of Data Reviewed External Data Reviewed: notes.  Risk OTC drugs. Prescription drug management.           Final Clinical Impression(s) / ED Diagnoses Final diagnoses:  None    Rx / DC Orders ED Discharge Orders          Ordered    tolnaftate (TINACTIN) 1 % spray  2 times daily        10/15/23 1758              Ma Rings, PA-C 10/15/23 1845    Pricilla Loveless, MD 10/15/23 2140

## 2023-10-15 NOTE — Discharge Instructions (Addendum)
You are seen in the ER today for foot pain, use Aquaphor topically on the cracked areas and keep your feet clean and dry make sure you wear a padded footwear.  Follow-up with primary care or foot doctor.  You can use the Tinactin to treat the areas between your toes.  Back for new or worsening symptoms.

## 2024-10-11 ENCOUNTER — Emergency Department (HOSPITAL_COMMUNITY): Payer: Self-pay

## 2024-10-11 ENCOUNTER — Encounter (HOSPITAL_COMMUNITY): Payer: Self-pay | Admitting: *Deleted

## 2024-10-11 ENCOUNTER — Emergency Department (HOSPITAL_COMMUNITY)
Admission: EM | Admit: 2024-10-11 | Discharge: 2024-10-11 | Disposition: A | Discharge status: Home / Self Care | Payer: Self-pay | Attending: Emergency Medicine | Admitting: Emergency Medicine

## 2024-10-11 ENCOUNTER — Other Ambulatory Visit: Payer: Self-pay

## 2024-10-11 DIAGNOSIS — M545 Low back pain, unspecified: Secondary | ICD-10-CM | POA: Insufficient documentation

## 2024-10-11 DIAGNOSIS — M25552 Pain in left hip: Secondary | ICD-10-CM | POA: Insufficient documentation

## 2024-10-11 MED ORDER — METHOCARBAMOL 500 MG PO TABS: 500.0000 mg | ORAL_TABLET | Freq: Three times a day (TID) | ORAL | 0 refills | Status: AC

## 2024-10-11 MED ORDER — HYDROCODONE-ACETAMINOPHEN 5-325 MG PO TABS: ORAL_TABLET | ORAL | 0 refills | Status: AC

## 2024-10-11 MED ORDER — OXYCODONE-ACETAMINOPHEN 5-325 MG PO TABS
1.0000 | ORAL_TABLET | Freq: Once | ORAL | Status: AC
  Administered 2024-10-11: 1 via ORAL
  Filled 2024-10-11: qty 1

## 2024-10-11 NOTE — ED Notes (Signed)
 See triage notes.  Pa at bedside.

## 2024-10-11 NOTE — Discharge Instructions (Addendum)
 Providers Accepting New Patients in [place], [place]    [facility] [address]  [place], [place] [address]J [phone] Accepts most insurances  [facility] [address] [place], [place] [address] [phone] Accepts most insurances  [facility] [address] [place], [place] [address]  [phone] Must meet requirements  [facility] [address] [place], [place] [address] [phone] Accepts most insurances  [facility] [address]  [place], [place] [address] [phone] Accepts most insurances  [facility] [address]   [place], [place]   [phone] Accepts most insurances  [facility])  [phone]  [place], [place] [address] [phone] Accepts most insurances     [facility] [address]  [place], [place] [address] [phone] Accepts most insurances  [facility] [address] [place], [place] [address] [phone] option 1 Accepts Medicaid and [facility] [address]  [place], [place] [address] [phone] Accepts most insurances  Benita Outhouse, MD [address] [place], [place] [address] [phone] Accepts most insurances  [facility] [address]  [place], [place] [address] [phone] Accepts most insurances  [facility] [phone] [place], [place] [address] [phone] Accepts most insurances  [place] [place] [address] [place], [place] [address] [phone]  Accepts most insurances   Alternate ice and heat or heat patches on and off to your lower back.  Avoid twisting bending or heavy lifting for at least 1 week.  Take medication as directed.  I have listed 2 of the local primary care clinics that you may contact one of them to establish primary  care.  Return to the ER for any new or worsening symptoms.

## 2024-10-11 NOTE — ED Notes (Signed)
 Pt taken to xray

## 2024-10-11 NOTE — ED Triage Notes (Signed)
 Pt with lower back pain on left side, started last night. Hx of back issues per pt.

## 2024-10-11 NOTE — ED Provider Notes (Signed)
 " [place] EMERGENCY DEPARTMENT AT [facility] Provider Note   CSN: [id number] Arrival date & time: 10/11/24  1244     Patient presents with: Back Pain   Jesse Gibbs is a 44 y.o. male.    Back Pain       Jesse Gibbs is a 44 y.o. male who presents to the Emergency Department complaining of left low back pain left hip pain that began last night.  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  No fall.  Last evening, he began having aching pain of his left low back that radiated to his left groin area.  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  Current pain feels similar.  He has pain with rotation of his hip and when attempting to stand erect from a bent over position.  Pain improves at rest.  He denies any dysuria symptoms, hematuria, pain numbness or weakness of the lower extremities, abdominal pain, and saddle anesthesias.   Prior to Admission medications  Medication Sig Start Date End Date Taking? Authorizing Provider  doxycycline  (VIBRAMYCIN ) 100 MG capsule Take 1 capsule (100 mg total) by mouth 2 (two) times daily. 09/21/22   Tyniesha Howald, PA-C  HYDROcodone -acetaminophen  (NORCO/VICODIN) 5-325 MG tablet Take 2 tablets by mouth every 4 (four) hours as needed for up to 8 doses. 06/03/21   Phebe Fonda RAMAN, MD  lidocaine  (LIDODERM ) 5 % Place 1 patch onto the skin daily. Remove & Discard patch within 12 hours or as directed by MD 11/19/19   Henderly, Britni A, PA-C  methocarbamol  (ROBAXIN -750) 750 MG tablet Take 1 tablet (750 mg total) by mouth 4 (four) times daily. 03/02/19   Idol, Julie, PA-C  naproxen  (NAPROSYN ) 500 MG tablet Take 1 tablet (500 mg total) by mouth 2 (two) times daily. 11/19/19   Henderly, Britni A, PA-C  tolnaftate  (TINACTIN) 1 % spray Apply topically 2 (two) times daily. 10/15/23   Suellen Cantor A, PA-C    Allergies: Patient has no known allergies.    Review of Systems  Musculoskeletal:  Positive for  arthralgias (left hip pain) and back pain.  All other systems reviewed and are negative.   Updated Vital Signs BP (!) 133/56   Pulse 83   Temp (!) 97.5 F (36.4 C) (Oral)   Resp 18   Ht 5' 6.5 (1.689 m)   Wt 77 kg   BMI 26.99 kg/m   Physical Exam Vitals and nursing note reviewed.  Constitutional:      General: He is not in acute distress.    Appearance: Normal appearance. He is not toxic-appearing.  Cardiovascular:     Rate and Rhythm: Normal rate and regular rhythm.     Pulses: Normal pulses.  Pulmonary:     Effort: Pulmonary effort is normal.     Breath sounds: Normal breath sounds.  Chest:     Chest wall: No tenderness.  Abdominal:     Palpations: Abdomen is soft.     Tenderness: There is no abdominal tenderness. There is no right CVA tenderness or left CVA tenderness.  Musculoskeletal:        General: Normal range of motion.       Back:     Comments: Localized ttp of the left lower lumbar paraspinal muscles.  Positive SLR on left.    Skin:    General: Skin is warm.     Capillary Refill: Capillary refill takes less than 2 seconds.  Neurological:     General: No focal deficit present.     Mental Status: He is alert.     Sensory: No sensory deficit.     Motor: No weakness.     (all labs ordered are listed, but only abnormal results are displayed) Labs Reviewed - No data to display  EKG: None  Radiology: DG Lumbar Spine Complete Result Date: 10/11/2024 CLINICAL DATA:  Acute left-sided lower back pain EXAM: LUMBAR SPINE - COMPLETE 4+ VIEW COMPARISON:  November 19, 2019 FINDINGS: There is no evidence of lumbar spine fracture. Alignment is normal. Minimal degenerative disc disease is noted at L1-2 and L2-3 with anterior osteophyte formation. IMPRESSION: Minimal multilevel degenerative disc disease. No acute abnormality seen. Electronically Signed   By: Lynwood Landy Raddle M.D.   On: 10/11/2024 14:14   DG Hip Unilat W or Wo Pelvis 2-3 Views Left Result Date:  10/11/2024 CLINICAL DATA:  Acute left hip pain EXAM: DG HIP (WITH OR WITHOUT PELVIS) 2-3V LEFT COMPARISON:  None Available. FINDINGS: There is no evidence of hip fracture or dislocation. There is no evidence of arthropathy or other focal bone abnormality. IMPRESSION: Negative. Electronically Signed   By: Lynwood Landy Raddle M.D.   On: 10/11/2024 14:13     Procedures   Medications Ordered in the ED  oxyCODONE -acetaminophen  (PERCOCET/ROXICET) 5-325 MG per tablet 1 tablet (has no administration in time range)                                    Medical Decision Making   Patient here with left low back pain left hip pain.  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  Pain does not radiate into his abdomen or groin.  No pain numbness or weakness of his lower extremities or saddle anesthesias.  No dysuria symptoms  I suspect pain is MSK.  Herniated disc cauda equina also considered felt less likely given reassuring exam  Patient is ambulatory with steady gait.  No focal neurodeficits  Amount and/or Complexity of Data Reviewed Radiology: ordered.    Details: X-ray of the hip negative  X-ray of the lumbar spine shows degenerative changes with anterior osteophyte formation L1-2 and L2-3 Discussion of management or test interpretation with external provider(s): Pain addressed here.  He is ambulatory in the department with steady gait.  No red flags for cauda equina  Will treat symptomatically, database reviewed.  He does not currently have a PCP.  Follow-up information for local primary care given.  Return precautions also given.  Risk Prescription drug management.        Final diagnoses:  Acute left-sided low back pain without sciatica  Pain of left hip    ED Discharge Orders     None          Jesse Gibbs, Jesse Gibbs 10/11/24 1445    Jesse Ozell BROCKS, MD 10/11/24 1747  "
